# Patient Record
Sex: Female | Born: 1995 | Race: White | Hispanic: No | Marital: Married | Smoking: Never smoker
Health system: Southern US, Community
[De-identification: ages and names within clinical notes are randomized; demographics above are authoritative.]

## PROBLEM LIST (undated history)

## (undated) DIAGNOSIS — T7840XA Allergy, unspecified, initial encounter: Secondary | ICD-10-CM

## (undated) DIAGNOSIS — F329 Major depressive disorder, single episode, unspecified: Secondary | ICD-10-CM

## (undated) DIAGNOSIS — F32A Depression, unspecified: Secondary | ICD-10-CM

## (undated) DIAGNOSIS — G43909 Migraine, unspecified, not intractable, without status migrainosus: Secondary | ICD-10-CM

## (undated) DIAGNOSIS — K589 Irritable bowel syndrome without diarrhea: Secondary | ICD-10-CM

## (undated) DIAGNOSIS — R040 Epistaxis: Secondary | ICD-10-CM

## (undated) DIAGNOSIS — M419 Scoliosis, unspecified: Secondary | ICD-10-CM

## (undated) DIAGNOSIS — N83209 Unspecified ovarian cyst, unspecified side: Secondary | ICD-10-CM

## (undated) DIAGNOSIS — R5600 Simple febrile convulsions: Secondary | ICD-10-CM

## (undated) DIAGNOSIS — R000 Tachycardia, unspecified: Secondary | ICD-10-CM

## (undated) DIAGNOSIS — T8859XA Other complications of anesthesia, initial encounter: Secondary | ICD-10-CM

## (undated) DIAGNOSIS — F419 Anxiety disorder, unspecified: Secondary | ICD-10-CM

## (undated) HISTORY — DX: Unspecified ovarian cyst, unspecified side: N83.209

## (undated) HISTORY — DX: Depression, unspecified: F32.A

## (undated) HISTORY — DX: Epistaxis: R04.0

## (undated) HISTORY — PX: WISDOM TOOTH EXTRACTION: SHX21

## (undated) HISTORY — DX: Simple febrile convulsions: R56.00

## (undated) HISTORY — DX: Tachycardia, unspecified: R00.0

## (undated) HISTORY — DX: Irritable bowel syndrome, unspecified: K58.9

## (undated) HISTORY — DX: Anxiety disorder, unspecified: F41.9

## (undated) HISTORY — DX: Scoliosis, unspecified: M41.9

## (undated) HISTORY — DX: Migraine, unspecified, not intractable, without status migrainosus: G43.909

## (undated) HISTORY — DX: Allergy, unspecified, initial encounter: T78.40XA

---
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
## 2016-07-31 DIAGNOSIS — F419 Anxiety disorder, unspecified: Secondary | ICD-10-CM | POA: Insufficient documentation

## 2016-07-31 DIAGNOSIS — F32A Depression, unspecified: Secondary | ICD-10-CM | POA: Insufficient documentation

## 2016-07-31 DIAGNOSIS — A6920 Lyme disease, unspecified: Secondary | ICD-10-CM | POA: Insufficient documentation

## 2017-02-02 DIAGNOSIS — Z113 Encounter for screening for infections with a predominantly sexual mode of transmission: Secondary | ICD-10-CM | POA: Diagnosis not present

## 2017-02-02 DIAGNOSIS — Z0001 Encounter for general adult medical examination with abnormal findings: Secondary | ICD-10-CM | POA: Diagnosis not present

## 2017-02-02 DIAGNOSIS — K580 Irritable bowel syndrome with diarrhea: Secondary | ICD-10-CM | POA: Diagnosis not present

## 2017-02-03 DIAGNOSIS — K580 Irritable bowel syndrome with diarrhea: Secondary | ICD-10-CM | POA: Diagnosis not present

## 2017-02-03 DIAGNOSIS — Z113 Encounter for screening for infections with a predominantly sexual mode of transmission: Secondary | ICD-10-CM | POA: Diagnosis not present

## 2017-02-03 DIAGNOSIS — Z0001 Encounter for general adult medical examination with abnormal findings: Secondary | ICD-10-CM | POA: Diagnosis not present

## 2017-02-03 LAB — CBC AND DIFFERENTIAL
HCT: 44 (ref 36–46)
Hemoglobin: 14.8 (ref 12.0–16.0)
Neutrophils Absolute: 6168
Platelets: 397 (ref 150–399)
WBC: 9.9
WBC: 9.9

## 2017-02-03 LAB — BASIC METABOLIC PANEL
BUN: 11 (ref 4–21)
CO2: 26 — AB (ref 13–22)
Chloride: 104 (ref 99–108)
Creatinine: 0.8 (ref 0.5–1.1)
Glucose: 74
Potassium: 4.1 (ref 3.4–5.3)
Sodium: 139 (ref 137–147)

## 2017-02-03 LAB — COMPREHENSIVE METABOLIC PANEL
Albumin: 4.9 (ref 3.5–5.0)
Calcium: 9.9 (ref 8.7–10.7)
GFR calc Af Amer: 130
GFR calc non Af Amer: 112
Globulin: 3

## 2017-02-03 LAB — HEPATIC FUNCTION PANEL
ALT: 35 (ref 7–35)
AST: 22 (ref 13–35)
Alkaline Phosphatase: 80 (ref 25–125)
Bilirubin, Total: 0.4

## 2017-02-03 LAB — HIV ANTIBODY (ROUTINE TESTING W REFLEX): HIV 1&2 Ab, 4th Generation: NEGATIVE

## 2017-02-03 LAB — CBC: RBC: 5.24 — AB (ref 3.87–5.11)

## 2017-02-03 LAB — TSH: TSH: 0.7 (ref 0.41–5.90)

## 2017-04-14 DIAGNOSIS — Z111 Encounter for screening for respiratory tuberculosis: Secondary | ICD-10-CM | POA: Diagnosis not present

## 2018-01-25 DIAGNOSIS — N920 Excessive and frequent menstruation with regular cycle: Secondary | ICD-10-CM | POA: Diagnosis not present

## 2018-01-25 DIAGNOSIS — G43009 Migraine without aura, not intractable, without status migrainosus: Secondary | ICD-10-CM | POA: Diagnosis not present

## 2018-01-25 DIAGNOSIS — Z111 Encounter for screening for respiratory tuberculosis: Secondary | ICD-10-CM | POA: Diagnosis not present

## 2018-01-26 LAB — HEPATIC FUNCTION PANEL
ALT: 30 (ref 7–35)
AST: 20 (ref 13–35)
Alkaline Phosphatase: 85 (ref 25–125)
Bilirubin, Total: 0.5

## 2018-01-26 LAB — TSH: TSH: 1.71 (ref 0.41–5.90)

## 2018-01-26 LAB — BASIC METABOLIC PANEL
BUN: 8 (ref 4–21)
CO2: 25 — AB (ref 13–22)
Chloride: 102 (ref 99–108)
Creatinine: 0.8 (ref 0.5–1.1)
Glucose: 83
Potassium: 4.4 (ref 3.4–5.3)
Sodium: 139 (ref 137–147)

## 2018-01-26 LAB — COMPREHENSIVE METABOLIC PANEL
Albumin: 4.6 (ref 3.5–5.0)
Calcium: 10.6 (ref 8.7–10.7)
GFR calc Af Amer: 113
GFR calc non Af Amer: 97
Globulin: 3

## 2018-01-26 LAB — CBC AND DIFFERENTIAL
HCT: 46 (ref 36–46)
Hemoglobin: 15.2 (ref 12.0–16.0)
Neutrophils Absolute: 4618
Platelets: 358 (ref 150–399)
WBC: 8.6

## 2018-01-26 LAB — CBC: RBC: 5.32 — AB (ref 3.87–5.11)

## 2018-04-13 DIAGNOSIS — R000 Tachycardia, unspecified: Secondary | ICD-10-CM | POA: Diagnosis not present

## 2018-04-13 DIAGNOSIS — G43009 Migraine without aura, not intractable, without status migrainosus: Secondary | ICD-10-CM | POA: Diagnosis not present

## 2019-01-07 ENCOUNTER — Encounter: Payer: Self-pay | Admitting: Family

## 2019-01-07 ENCOUNTER — Ambulatory Visit (INDEPENDENT_AMBULATORY_CARE_PROVIDER_SITE_OTHER): Payer: BC Managed Care – PPO | Admitting: Family

## 2019-01-07 ENCOUNTER — Other Ambulatory Visit: Payer: Self-pay

## 2019-01-07 VITALS — BP 130/80 | HR 114 | Temp 99.5°F | Ht 66.0 in | Wt 145.8 lb

## 2019-01-07 DIAGNOSIS — G43109 Migraine with aura, not intractable, without status migrainosus: Secondary | ICD-10-CM | POA: Diagnosis not present

## 2019-01-07 DIAGNOSIS — M419 Scoliosis, unspecified: Secondary | ICD-10-CM | POA: Insufficient documentation

## 2019-01-07 DIAGNOSIS — F339 Major depressive disorder, recurrent, unspecified: Secondary | ICD-10-CM | POA: Insufficient documentation

## 2019-01-07 DIAGNOSIS — F411 Generalized anxiety disorder: Secondary | ICD-10-CM | POA: Diagnosis not present

## 2019-01-07 DIAGNOSIS — R000 Tachycardia, unspecified: Secondary | ICD-10-CM | POA: Insufficient documentation

## 2019-01-07 DIAGNOSIS — N83209 Unspecified ovarian cyst, unspecified side: Secondary | ICD-10-CM

## 2019-01-07 DIAGNOSIS — K582 Mixed irritable bowel syndrome: Secondary | ICD-10-CM | POA: Insufficient documentation

## 2019-01-07 DIAGNOSIS — Z87898 Personal history of other specified conditions: Secondary | ICD-10-CM

## 2019-01-07 MED ORDER — METOPROLOL TARTRATE 25 MG PO TABS: 12.5000 mg | ORAL_TABLET | Freq: Two times a day (BID) | ORAL | 0 refills | Status: DC

## 2019-01-07 NOTE — Progress Notes (Signed)
Provider: Marlowe Sax FNP-C   Riyansh Gerstner, Nelda Bucks, NP  Patient Care Team: Pria Klosinski, Nelda Bucks, NP as PCP - General (Family Medicine)  Extended Emergency Contact Information Primary Emergency Contact: Maddyx, Vallie Mobile Phone: [phone] Relation: Spouse   Code Status: Full code  Goals of care: Advanced Directive information No flowsheet data found.   Chief Complaint  Patient presents with  . New Patient (Initial Visit)    New Patient to Establish Care patient would like referral to cardiology states she has  tachycardia also would like referral to neurology. Patient states she plans to have baby in next year. Patient would like to get set up for pap smear.     HPI:  Pt is a 23 y.o. 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  Tachycardia - Has had this for several years.Has never taken any medication for her tachycardia.she has palpitation.she does gets some chest pain if she exert herself like climbing up the stairs or trying to exercise if HR goes to 200.she has a smart watch that monitors her heart rate gives her a warning if HR is too high.This has helped her a lot to control her tachycardia.she denies any chest pain or shortness of breath.she states would like referral to cardiology for chronicTachycardia.  Migraines - Describes migraines as real bad.Has nausea,vomiting and left side weakness whenever migraines occur.she has not had this for a while now.she would also like referral to neurology for evaluation. Currently on Excedrin four times daily as needed and  amitriptyline 75 mg tablet daily at bedtime which states helps with her sleep too.   Generalized anxiety/depression - states symptoms under control not on antianxiety medication. No  thoughts of suicide.  Ovarian  Cysts - has had this for several years.Has been treated with Oral contraceptive since she was 23 years old.currnetly on Larissia 0.1- 20 mg -mcg Tablet daily.she states  plans to have a baby in a year. Patient would like to get set up for pap smear with Gyn.  Health Maintenance - she is up to date on her Influenza vaccine given 2020 and Tdap last given 2017. 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    Past Medical History:  Diagnosis Date  . Anxiety   . Depression   . Epistaxis   . Febrile seizure ([place])    23 y.o.   . IBS (irritable bowel syndrome)   . Migraines   . Ovarian cyst   . Scoliosis   . Tachycardia    Past Surgical History:  Procedure Laterality Date  . WISDOM TOOTH EXTRACTION     2017 [facility] Facial Surgery     No Known Allergies  Allergies as of 01/07/2019   No Known Allergies     Medication List       Accurate as of January 07, 2019 11:38 AM. If you have any questions, ask your nurse or doctor.        amitriptyline 75 MG tablet Commonly known as: ELAVIL Take 75 mg by mouth at bedtime.   [place] Patient takes 1 tab by mouth 4 times a month as needed   Larissia 0.1-20 MG-MCG tablet Generic drug: levonorgestrel-ethinyl estradiol Take 1 tablet by  mouth daily.   One-A-Day Womens tablet Take 1 tablet by mouth daily.       Review of Systems  Constitutional: Negative for appetite change, chills and fatigue.       Temp 99.5   HENT: Negative for congestion, rhinorrhea, sinus pressure, sinus pain, sneezing and sore throat.   Eyes: Positive for visual disturbance. Negative for pain, discharge and redness.       Wears eye contact   Respiratory: Negative for chest tightness, shortness of breath and wheezing.   Cardiovascular: Positive for palpitations. Negative for chest  pain and leg swelling.  Gastrointestinal: Negative for abdominal distention, abdominal pain, constipation, nausea and vomiting.       Hx IBS   Endocrine: Positive for cold intolerance. Negative for heat intolerance, polydipsia, polyphagia and polyuria.  Genitourinary: Negative for difficulty urinating, dysuria, flank pain, frequency and urgency.  Musculoskeletal: Negative for arthralgias and gait problem.  Skin: Negative for color change, pallor and rash.  Neurological: Positive for headaches. Negative for dizziness, syncope, weakness and light-headedness.       Migraines   Hematological: Bruises/bleeds easily.  Psychiatric/Behavioral: Negative for agitation, confusion, sleep disturbance and suicidal ideas. The patient is nervous/anxious.     Immunization History  Administered Date(s) Administered  . Influenza,inj,Quad PF,6+ Mos 12/10/2018  . Td 02/14/2016   Pertinent  Health Maintenance Due  Topic Date Due  . PAP SMEAR-Modifier  09/24/2014  . INFLUENZA VACCINE  Completed   No flowsheet data found.   Vitals:   01/07/19 1107  BP: 130/80  Pulse: (!) 114  Temp: 99.5 F (37.5 C)  TempSrc: Temporal  SpO2: 97%  Weight: 145 lb 12.8 oz (66.1 kg)  Height: _0  (1.676 m)   Body mass index is 23.53 kg/m. Physical Exam Constitutional:      General: She is not in acute distress.    Appearance: She is normal weight. She is not ill-appearing.  HENT:     Head: Normocephalic.     Right Ear: Tympanic membrane, ear canal and external ear normal. There is no impacted cerumen.     Left Ear: Tympanic membrane, ear canal and external ear normal. There is no impacted cerumen.     Nose: Nose normal. No congestion or rhinorrhea.     Mouth/Throat:     Mouth: Mucous membranes are moist.     Pharynx: Oropharynx is clear. No oropharyngeal exudate or posterior oropharyngeal erythema.  Eyes:     General: No scleral icterus.       Right eye: No discharge.        Left eye: No discharge.      Extraocular Movements: Extraocular movements intact.     Conjunctiva/sclera: Conjunctivae normal.     Pupils: Pupils are equal, round, and reactive to light.  Neck:     Musculoskeletal: Normal range of motion. No neck rigidity or muscular tenderness.     Vascular: No carotid bruit.  Cardiovascular:     Rate and Rhythm: Regular rhythm. Tachycardia present.     Pulses: Normal pulses.     Heart sounds: Normal heart sounds. No murmur. No friction rub. No gallop.   Pulmonary:     Effort: Pulmonary effort is normal. No respiratory distress.     Breath sounds: Normal breath sounds. No wheezing, rhonchi or rales.  Chest:     Chest wall: No tenderness.  Abdominal:     General: Bowel sounds are normal.     Palpations: Abdomen is soft. There is no mass.  Tenderness: There is no right CVA tenderness, left CVA tenderness, guarding or rebound.     Comments: Left lower Quadrant tender to deep palpation   Lymphadenopathy:     Cervical: No cervical adenopathy.  Neurological:     Mental Status: She is alert.    Labs reviewed: No results for input(s): NA, K, CL, CO2, GLUCOSE, BUN, CREATININE, CALCIUM, MG, PHOS in the last 8760 hours. No results for input(s): AST, ALT, ALKPHOS, BILITOT, PROT, ALBUMIN in the last 8760 hours. No results for input(s): WBC, NEUTROABS, HGB, HCT, MCV, PLT in the last 8760 hours. No results found for: TSH No results found for: HGBA1C No results found for: CHOL, HDL, LDLCALC, LDLDIRECT, TRIG, CHOLHDL  Significant Diagnostic Results in last 30 days:  No results found.  Assessment/Plan 1. Tachycardia HR 114 ongoing since she was young.Has never been seen by a cardiology.Past TSH level have been within normal range.Has learned to copy with symptoms and takes a rest when HR is high. - EKG 12-Lead done this visit indicates sinus Tachycardia rate 103 b/min. - Ambulatory referral to Cardiology - metoprolol tartrate (LOPRESSOR) 25 MG tablet; Take 0.5 tablets (12.5 mg total)  by mouth 2 (two) times daily.  Dispense: 60 tablet; Refill: 0 - TSH; Future - CBC with Differential/Platelet; Future - Lipid panel; Future  2. Migraine with aura and without status migrainosus, not intractable Chronic.continue on Excedrin as needed  and amitriptyline 75 mg tablet at bedtime. - Ambulatory referral to Neurology  3. Generalized anxiety disorder Reports stable.currently not on any antianxiety.D/W adding antianxiety which could be contributing to her Tachycardia.will rule out other Cardio etiologies.   4. Recurrent major depressive disorder, remission status unspecified (HCC) Mood stable.continue on amitriptyline 75 mg tablet at bedtime.   5. Irritable bowel syndrome with both constipation and diarrhea Reports symptoms under control.will refer to GI if needed.  - CMP with eGFR([facility]); Future  6. Cyst of ovary, unspecified laterality Chronic since teenage.continue on [place] tablet daily.will call her Gyn.Planning to have a baby in one year.   7. Scoliosis, unspecified scoliosis type, unspecified spinal region Chronic.reports no new issues with pain,weakness or shortness of breath.   8. Hx of epistaxis Occasional nasal bleeding.  Family/ staff Communication: Reviewed plan of care with patient  Labs/tests ordered:  - TSH; Future - CBC with Differential/Platelet; Future - Lipid panel; Future - CMP with eGFR([facility]); Future  Next appointment : 2 weeks for Tachycardia   Sandrea Hughs, NP

## 2019-01-13 ENCOUNTER — Other Ambulatory Visit: Payer: Self-pay

## 2019-01-13 DIAGNOSIS — K582 Mixed irritable bowel syndrome: Secondary | ICD-10-CM

## 2019-01-13 DIAGNOSIS — R000 Tachycardia, unspecified: Secondary | ICD-10-CM

## 2019-01-14 ENCOUNTER — Other Ambulatory Visit: Payer: Self-pay

## 2019-01-14 ENCOUNTER — Other Ambulatory Visit: Payer: BC Managed Care – PPO

## 2019-01-14 DIAGNOSIS — K582 Mixed irritable bowel syndrome: Secondary | ICD-10-CM | POA: Diagnosis not present

## 2019-01-14 DIAGNOSIS — R000 Tachycardia, unspecified: Secondary | ICD-10-CM | POA: Diagnosis not present

## 2019-01-14 DIAGNOSIS — E785 Hyperlipidemia, unspecified: Secondary | ICD-10-CM | POA: Diagnosis not present

## 2019-01-15 LAB — CBC WITH DIFFERENTIAL/PLATELET
Absolute Monocytes: 423 cells/uL (ref 200–950)
Basophils Absolute: 44 cells/uL (ref 0–200)
Basophils Relative: 0.6 %
Eosinophils Absolute: 102 cells/uL (ref 15–500)
Eosinophils Relative: 1.4 %
HCT: 42.4 % (ref 35.0–45.0)
Hemoglobin: 13.9 g/dL (ref 11.7–15.5)
Lymphs Abs: 3146 cells/uL (ref 850–3900)
MCH: 28.4 pg (ref 27.0–33.0)
MCHC: 32.8 g/dL (ref 32.0–36.0)
MCV: 86.5 fL (ref 80.0–100.0)
MPV: 9.4 fL (ref 7.5–12.5)
Monocytes Relative: 5.8 %
Neutro Abs: 3584 cells/uL (ref 1500–7800)
Neutrophils Relative %: 49.1 %
Platelets: 368 10*3/uL (ref 140–400)
RBC: 4.9 10*6/uL (ref 3.80–5.10)
RDW: 12.1 % (ref 11.0–15.0)
Total Lymphocyte: 43.1 %
WBC: 7.3 10*3/uL (ref 3.8–10.8)

## 2019-01-15 LAB — LIPID PANEL
Cholesterol: 186 mg/dL (ref <200)
HDL: 44 mg/dL — ABNORMAL LOW (ref >=50)
LDL Cholesterol (Calc): 126 mg/dL (calc) — ABNORMAL HIGH
Non-HDL Cholesterol (Calc): 142 mg/dL (calc) — ABNORMAL HIGH (ref <130)
Total CHOL/HDL Ratio: 4.2 (calc) (ref <5.0)
Triglycerides: 70 mg/dL (ref <150)

## 2019-01-15 LAB — COMPLETE METABOLIC PANEL WITH GFR
AG Ratio: 1.6 (calc) (ref 1.0–2.5)
ALT: 36 U/L — ABNORMAL HIGH (ref 6–29)
AST: 19 U/L (ref 10–30)
Albumin: 4.2 g/dL (ref 3.6–5.1)
Alkaline phosphatase (APISO): 74 U/L (ref 31–125)
BUN: 11 mg/dL (ref 7–25)
CO2: 23 mmol/L (ref 20–32)
Calcium: 9.4 mg/dL (ref 8.6–10.2)
Chloride: 109 mmol/L (ref 98–110)
Creat: 0.94 mg/dL (ref 0.50–1.10)
GFR, Est African American: 98 mL/min/{1.73_m2} (ref >=60)
GFR, Est Non African American: 84 mL/min/{1.73_m2} (ref >=60)
Globulin: 2.6 g/dL (calc) (ref 1.9–3.7)
Glucose, Bld: 97 mg/dL (ref 65–99)
Potassium: 4.6 mmol/L (ref 3.5–5.3)
Sodium: 142 mmol/L (ref 135–146)
Total Bilirubin: 0.5 mg/dL (ref 0.2–1.2)
Total Protein: 6.8 g/dL (ref 6.1–8.1)

## 2019-01-15 LAB — TSH: TSH: 1.31 mIU/L

## 2019-01-21 ENCOUNTER — Ambulatory Visit (INDEPENDENT_AMBULATORY_CARE_PROVIDER_SITE_OTHER): Payer: BC Managed Care – PPO | Admitting: Family

## 2019-01-21 ENCOUNTER — Other Ambulatory Visit: Payer: Self-pay

## 2019-01-21 ENCOUNTER — Encounter: Payer: Self-pay | Admitting: Family

## 2019-01-21 VITALS — BP 130/80 | HR 102 | Temp 97.6°F | Ht 66.0 in | Wt 147.2 lb

## 2019-01-21 DIAGNOSIS — G43109 Migraine with aura, not intractable, without status migrainosus: Secondary | ICD-10-CM | POA: Diagnosis not present

## 2019-01-21 DIAGNOSIS — R000 Tachycardia, unspecified: Secondary | ICD-10-CM

## 2019-01-21 NOTE — Progress Notes (Signed)
Provider: Marlowe Sax FNP-C  Levis Nazir, Nelda Bucks, NP  Patient Care Team: Davie Sagona, Nelda Bucks, NP as PCP - General (Family Medicine)  Extended Emergency Contact Information Primary Emergency Contact: Janeisha, Ryle Mobile Phone: [phone] Relation: Spouse  Code Status:  Full Code  Goals of care: Advanced Directive information No flowsheet data found.   Chief Complaint  Patient presents with  . Follow-up    2-week followup for tachycardia and continues with migraines    HPI:  Pt is a 23 y.o. female seen today for an acute visit for 2 weeks follow up for Tachycardia.started on metoprolol 12.5 mg tablet twice daily in the previous visit.she states has been tracking her heart rate using her apple watch and has not seen any changes compared to previous readings before taking medication.she denies chest pain but states gets chest tightness on exertion like climbing the stairs.she noted HR in the 170's climbing the stairs to work.usually takes rest and HR goes down.she has upcoming appointment with cardiology 01/24/2019.  She continues to have migraines about 4 times per month.Has upcoming appointment with Neurologist in one week.    Past Medical History:  Diagnosis Date  . Anxiety   . Depression   . Epistaxis   . Febrile seizure ([place])    23 y.o.   . IBS (irritable bowel syndrome)   . Migraines   . Ovarian cyst   . Scoliosis   . Tachycardia    Past Surgical History:  Procedure Laterality Date  . WISDOM TOOTH EXTRACTION     2017 [facility] Facial Surgery     No Known Allergies  Outpatient Encounter Medications as of 01/21/2019  Medication Sig  . amitriptyline (ELAVIL) 75 MG tablet Take 75 mg by mouth at bedtime.  . Aspirin-Acetaminophen-Caffeine (EXCEDRIN MIGRAINE PO) Patient takes 1 tab by mouth 4 times a month as needed  . LARISSIA 0.1-20 MG-MCG tablet Take 1 tablet by mouth daily.  . metoprolol tartrate (LOPRESSOR) 25 MG tablet Take 0.5 tablets (12.5 mg total) by mouth  2 (two) times daily.  . Multiple Vitamins-Minerals (ONE-A-DAY WOMENS) tablet Take 1 tablet by mouth daily.   No facility-administered encounter medications on file as of 01/21/2019.     Review of Systems  Constitutional: Negative for chills, fatigue and fever.  Respiratory: Negative for cough, chest tightness, shortness of breath and wheezing.   Cardiovascular: Negative for chest pain, palpitations and leg swelling.    Immunization History  Administered Date(s) Administered  . Influenza,inj,Quad PF,6+ Mos 12/10/2018  . Td 02/14/2016   Pertinent  Health Maintenance Due  Topic Date Due  . PAP SMEAR-Modifier  09/24/2014  . INFLUENZA VACCINE  Completed   No flowsheet data found.  Vitals:   01/21/19 1250 01/21/19 1307  BP: 130/80   Pulse: (!) 116 (!) 102  Temp: 97.6 F (36.4 C)   TempSrc: Oral   SpO2: 99%   Weight: 147 lb 3.2 oz (66.8 kg)   Height: 5\' 6"  (1.676 m)    Body mass index is 23.76 kg/m. Physical Exam Vitals signs reviewed.  Constitutional:      General: She is not in acute distress.    Appearance: She is normal weight. She is not ill-appearing.  HENT:     Nose: Nose normal. No congestion or rhinorrhea.     Mouth/Throat:     Mouth: Mucous membranes are moist.     Pharynx: Oropharynx is clear. No oropharyngeal exudate or posterior oropharyngeal erythema.  Eyes:     General: No scleral icterus.  Right eye: No discharge.        Left eye: No discharge.     Conjunctiva/sclera: Conjunctivae normal.     Pupils: Pupils are equal, round, and reactive to light.  Neck:     Musculoskeletal: Normal range of motion. No neck rigidity or muscular tenderness.     Vascular: No carotid bruit.  Cardiovascular:     Rate and Rhythm: Regular rhythm. Tachycardia present.     Pulses: Normal pulses.     Heart sounds: Normal heart sounds. No murmur. No friction rub. No gallop.   Pulmonary:     Effort: Pulmonary effort is normal. No respiratory distress.     Breath sounds:  Normal breath sounds. No wheezing, rhonchi or rales.  Chest:     Chest wall: No tenderness.  Abdominal:     General: Bowel sounds are normal. There is no distension.     Palpations: Abdomen is soft. There is no mass.     Tenderness: There is no abdominal tenderness. There is no right CVA tenderness, left CVA tenderness, guarding or rebound.  Musculoskeletal: Normal range of motion.        General: No swelling or tenderness.     Right lower leg: No edema.     Left lower leg: No edema.  Lymphadenopathy:     Cervical: No cervical adenopathy.  Skin:    General: Skin is warm and dry.     Coloration: Skin is not pale.     Findings: No bruising, erythema or rash.  Neurological:     Mental Status: She is alert and oriented to person, place, and time.     Cranial Nerves: No cranial nerve deficit.     Sensory: No sensory deficit.     Motor: No weakness.     Gait: Gait normal.  Psychiatric:        Mood and Affect: Mood normal.        Behavior: Behavior normal.        Thought Content: Thought content normal.        Judgment: Judgment normal.    Labs reviewed: Recent Labs    01/14/19 0813  NA 142  K 4.6  CL 109  CO2 23  GLUCOSE 97  BUN 11  CREATININE 0.94  CALCIUM 9.4   Recent Labs    01/14/19 0813  AST 19  ALT 36*  BILITOT 0.5  PROT 6.8   Recent Labs    01/14/19 0813  WBC 7.3  NEUTROABS 3,584  HGB 13.9  HCT 42.4  MCV 86.5  PLT 368   Lab Results  Component Value Date   TSH 1.31 01/14/2019   No results found for: HGBA1C Lab Results  Component Value Date   CHOL 186 01/14/2019   HDL 44 (L) 01/14/2019   LDLCALC 126 (H) 01/14/2019   TRIG 70 01/14/2019   CHOLHDL 4.2 01/14/2019    Significant Diagnostic Results in last 30 days:  No results found.  Assessment/Plan  1. Tachycardia HR persistent > 110's despite use of metoprolol 12.5 mg tablet twice daily.Recommend increase medication to 25 mg tablet but patient's prefers to wait until Monday to see the  cardiologist.continue on metoprolol.Encourage to take rest and avoid strenuous activities.   2. Migraine with aura and without status migrainosus, not intractable Continue on amitriptyline and Excedrin.Follow up with Neurologist.  Family/ staff Communication: Reviewed plan of care with patient.  Labs/tests ordered: None   Next appointment: 6 months for medical management of chronic issues.  Sandrea Hughs, NP

## 2019-01-24 ENCOUNTER — Other Ambulatory Visit: Payer: Self-pay

## 2019-01-24 ENCOUNTER — Ambulatory Visit: Payer: BC Managed Care – PPO | Admitting: Cardiovascular Disease

## 2019-01-24 ENCOUNTER — Encounter: Payer: Self-pay | Admitting: Cardiovascular Disease

## 2019-01-24 ENCOUNTER — Other Ambulatory Visit: Payer: Self-pay | Admitting: Cardiovascular Disease

## 2019-01-24 VITALS — BP 102/74 | HR 117 | Ht 65.0 in | Wt 146.8 lb

## 2019-01-24 DIAGNOSIS — R000 Tachycardia, unspecified: Secondary | ICD-10-CM | POA: Diagnosis not present

## 2019-01-24 MED ORDER — IVABRADINE HCL 5 MG PO TABS: 5.0000 mg | ORAL_TABLET | Freq: Two times a day (BID) | ORAL | 11 refills | Status: DC

## 2019-01-24 NOTE — Patient Instructions (Signed)
Medication Instructions:  Your physician has recommended you make the following change in your medication: START Corlanor (Ivabradine) 5 mg twice daily  *If you need a refill on your cardiac medications before your next appointment, please call your pharmacy*  Lab Work: None Ordered   Testing/Procedures: Your physician has requested that you have an echocardiogram. Echocardiography is a painless test that uses sound waves to create images of your heart. It provides your doctor with information about the size and shape of your heart and how well your heart's chambers and valves are working. This procedure takes approximately one hour. There are no restrictions for this procedure.    Follow-Up: At [facility], you and your health needs are our priority.  As part of our continuing mission to provide you with exceptional heart care, we have created designated Provider Care Teams.  These Care Teams include your primary Cardiologist (physician) and Advanced Practice Providers (APPs -  Physician Assistants and Nurse Practitioners) who all work together to provide you with the care you need, when you need it.  Your next appointment:   4 weeks on December 7  The format for your next appointment:   In Person  Provider:   You may see Dr. Acie Fredrickson or one of the following Advanced Practice Providers on your designated Care Team:    Richardson Dopp, PA-C  [place], [place]  Daune Perch, [place]

## 2019-01-24 NOTE — Progress Notes (Signed)
Cardiology Office Note:    Date:  01/24/2019   ID:  Virginia Kane, DOB 03-01-1996, MRN [id number]  PCP:  Caesar Bookman, NP  Cardiologist:  Nahser  Electrophysiologist:  None   Referring MD: Caesar Bookman, NP   Chief Complaint  Patient presents with  . Tachycardia     History of Present Illness:    Virginia Kane is a 23 y.o. female with a hx of sinus tach Has had since age 74  Has had echo in the past  Does not do cardio because she is scared.  Sleeps well  Does not exercise as much  Takes Elavil for migraines Tachycardia does not vary with her menstral cycle.   easts and drinks regularly .   previous work up is not available    Past Medical History:  Diagnosis Date  . Anxiety   . Depression   . Epistaxis   . Febrile seizure (HCC)    23 y.o.   . IBS (irritable bowel syndrome)   . Migraines   . Ovarian cyst   . Scoliosis   . Tachycardia     Past Surgical History:  Procedure Laterality Date  . WISDOM TOOTH EXTRACTION     2017 [facility]     Current Medications: Current Meds  Medication Sig  . amitriptyline (ELAVIL) 75 MG tablet Take 75 mg by mouth at bedtime.  . Aspirin-Acetaminophen-Caffeine (EXCEDRIN MIGRAINE PO) Patient takes 1 tab by mouth 4 times a month as needed  . LARISSIA 0.1-20 MG-MCG tablet Take 1 tablet by mouth daily.  . metoprolol tartrate (LOPRESSOR) 25 MG tablet Take 0.5 tablets (12.5 mg total) by mouth 2 (two) times daily.  . Multiple Vitamins-Minerals (ONE-A-DAY WOMENS) tablet Take 1 tablet by mouth daily.     Allergies:   Patient has no known allergies.   Social History   Socioeconomic History  . Marital status: Married    Spouse name: Not on file  . Number of children: Not on file  . Years of education: Not on file  . Highest education level: Not on file  Occupational History  . Not on file  Social Needs  . Financial resource strain: Not on file  . Food insecurity    Worry: Not on file    Inability: Not on  file  . Transportation needs    Medical: Not on file    Non-medical: Not on file  Tobacco Use  . Smoking status: Never Smoker  . Smokeless tobacco: Never Used  Substance and Sexual Activity  . Alcohol use: Yes    Alcohol/week: 1.0 - 2.0 standard drinks    Types: 1 - 2 Standard drinks or equivalent per week  . Drug use: Not on file  . Sexual activity: Not on file  Lifestyle  . Physical activity    Days per week: Not on file    Minutes per session: Not on file  . Stress: Not on file  Relationships  . Social Musician on phone: Not on file    Gets together: Not on file    Attends religious service: Not on file    Active member of club or organization: Not on file    Attends meetings of clubs or organizations: Not on file    Relationship status: Not on file  Other Topics Concern  . Not on file  Social History Narrative   Social History      Diet? regular      Do  you drink/eat things with caffeine? yes      Marital status?           married                         What year were you married? 2020      Do you live in a house, apartment, assisted living, condo, trailer, etc.?  house      Is it one or more stories? one      How many persons live in your home? 2       Do you have any pets in your home? (please list) 2 dog 2 lizards      Highest level of education completed? 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       no                               Type & how often? Need to more often       Advanced Directives      Do you have a living will? no      Do you have a DNR form?               no                   If not, do you want to discuss one?no       Do you have signed POA/HPOA for forms? No       Functional Status      Do you have difficulty bathing or dressing yourself? no      Do you have difficulty preparing food or eating? no      Do you have difficulty managing your medications? No       Do  you have difficulty managing your finances? No       Do you have difficulty affording your medications? No         Family History: The patient's family history includes Anxiety disorder in her brother and mother; Arthritis in her maternal grandmother; Autism in her brother; Basal cell carcinoma in her mother; Breast cancer in her maternal grandmother; Diabetes in her maternal grandmother; Sjogren's syndrome in her mother; Skin cancer in her maternal grandmother.  ROS:   Please see the history of present illness.     All other systems reviewed and are negative.  EKGs/Labs/Other Studies Reviewed:    The following studies were reviewed today:   EKG:    Nov. 9, 2020:   Sinus tach at 117.   TWI inferiorl   Recent Labs: 01/14/2019: ALT 36; BUN 11; Creat 0.94; Hemoglobin 13.9; Platelets 368; Potassium 4.6; Sodium 142; TSH 1.31  Recent Lipid Panel    Component Value Date/Time   CHOL 186 01/14/2019 0813   TRIG 70 01/14/2019 0813   HDL 44 (L) 01/14/2019 0813   CHOLHDL 4.2 01/14/2019 0813   LDLCALC 126 (H) 01/14/2019 0813    Physical Exam:    VS:  BP 102/74   Pulse (!) 117   Ht 5\' 5"  (1.651 m)   Wt 146 lb 12.8 oz (66.6 kg)   SpO2 98%   BMI 24.43 kg/m     Wt Readings from Last 3 Encounters:  01/24/19 146 lb 12.8 oz (66.6 kg)  01/21/19 147 lb 3.2  oz (66.8 kg)  01/07/19 145 lb 12.8 oz (66.1 kg)     GEN:  Well nourished, well developed in no acute distress HEENT: Normal NECK: No JVD; No carotid bruits LYMPHATICS: No lymphadenopathy CARDIAC: RRR, no murmurs, rubs, gallops RESPIRATORY:  Clear to auscultation without rales, wheezing or rhonchi  ABDOMEN: Soft, non-tender, non-distended MUSCULOSKELETAL:  No edema; No deformity  SKIN: Warm and dry NEUROLOGIC:  Alert and oriented x 3 PSYCHIATRIC:  Normal affect   ASSESSMENT:    1. Sinus tachycardia    PLAN:    In order of problems listed above:  1. Sinus tachycardia: He will he has had episodes of sinus tachycardia for  many years.  She is on low-dose metoprolol but yet her heart rate remains over 117.  She does not really exercise because it makes her heart pound so much.  We will start her on Corlanor 5 mg p.o. twice daily.  Continue metoprolol.  We will get an echocardiogram.  I will see her again in 4 to 6 weeks for a follow-up visit.   Medication Adjustments/Labs and Tests Ordered: Current medicines are reviewed at length with the patient today.  Concerns regarding medicines are outlined above.  Orders Placed This Encounter  Procedures  . EKG 12-Lead  . ECHOCARDIOGRAM COMPLETE   Meds ordered this encounter  Medications  . ivabradine (CORLANOR) 5 MG TABS tablet    Sig: Take 1 tablet (5 mg total) by mouth 2 (two) times daily with a meal.    Dispense:  60 tablet    Refill:  11    Patient Instructions  Medication Instructions:  Your physician has recommended you make the following change in your medication: START Corlanor (Ivabradine) 5 mg twice daily  *If you need a refill on your cardiac medications before your next appointment, please call your pharmacy*  Lab Work: None Ordered   Testing/Procedures: Your physician has requested that you have an echocardiogram. Echocardiography is a painless test that uses sound waves to create images of your heart. It provides your doctor with information about the size and shape of your heart and how well your heart's chambers and valves are working. This procedure takes approximately one hour. There are no restrictions for this procedure.    Follow-Up: At [facility], you and your health needs are our priority.  As part of our continuing mission to provide you with exceptional heart care, we have created designated Provider Care Teams.  These Care Teams include your primary Cardiologist (physician) and Advanced Practice Providers (APPs -  Physician Assistants and Nurse Practitioners) who all work together to provide you with the care you need, when you  need it.  Your next appointment:   4 weeks on December 7  The format for your next appointment:   In Person  Provider:   You may see Dr. Elease HashimotoNahser or one of the following Advanced Practice Providers on your designated Care Team:    Tereso NewcomerScott Weaver, PA-C  Vin Honea PathBhagat, [place] JerseyPA-C  Berton BonJanine Hammond, [place]       Signed, Kristeen MissPhilip Nahser, MD  01/24/2019 5:56 PM    [facility]

## 2019-01-25 ENCOUNTER — Telehealth: Payer: Self-pay | Admitting: Pharmacist

## 2019-01-25 NOTE — Telephone Encounter (Signed)
Thank you for your help Megan.

## 2019-01-25 NOTE — Telephone Encounter (Signed)
Corlanor prior authorization approved through 01/24/2021, rx already sent to pharmacy at yesterday's office visit. If patient's copay is > $20, she would benefit from the Corlanor copay card.

## 2019-01-27 ENCOUNTER — Other Ambulatory Visit: Payer: Self-pay | Admitting: Family

## 2019-01-27 NOTE — Telephone Encounter (Signed)
Patient requesting refill for amitriptyline. Warning alerted with medication. Routing to provider for approval.

## 2019-01-27 NOTE — Telephone Encounter (Signed)
Received request from [facility] to change Corlanor to Toprol because it it covered by insurance.  Called and clarified with [facility] that the request was sent before PA was received.  The pharmacist states copay is $50.   To Dr. Elmarie Shiley nurse to give patient Corlanor copay card.

## 2019-01-29 ENCOUNTER — Other Ambulatory Visit: Payer: Self-pay | Admitting: Family

## 2019-01-29 DIAGNOSIS — R000 Tachycardia, unspecified: Secondary | ICD-10-CM

## 2019-01-31 NOTE — Progress Notes (Signed)
Virtual Visit via Video Note The purpose of this virtual visit is to provide medical care while limiting exposure to the novel coronavirus.    Consent was obtained for video visit:  Yes.   Answered questions that patient had about telehealth interaction:  Yes.   I discussed the limitations, risks, security and privacy concerns of performing an evaluation and management service by telemedicine. I also discussed with the patient that there may be a patient responsible charge related to this service. The patient expressed understanding and agreed to proceed.  Pt location: Home Physician Location: Home Name of referring provider:  Ngetich, Nelda Bucks, NP I connected with Hughie Closs at patients initiation/request on 02/02/2019 at  7:50 AM EST by video enabled telemedicine application and verified that I am speaking with the correct person using two identifiers. Pt MRN:  [id number] Pt DOB:  Feb 13, 1996 Video Participants:  Hughie Closs   History of Present Illness:  Virginia Kane is a 23 year old female with tachycardia, depression and anxiety who presents for migraines.  History supplemented by referring provider notes.  Onset:  Migraines since childhood.  Relatively well-controlled Location:  Holocephalic but primarily bitemporal Quality:  Pressure and throbbing Intensity:  If delays treatment, then 8/10; if treats early, then 6/10; denies new headache, thunderclap headache or severe headache that wakes  from sleep. Aura:  none Premonitory Phase:  none Postdrome:  Usually no Associated symptoms:  Nausea, vomiting, blurred vision, photophobia, phonophobia, osmophobia.  Rarely, vomiting or pain is so severe that she has passed out. She has had 3 episodes of left hemiplegic migraine (arm and leg) associated with left sided numbness (including face) with some slurred speech, that resolves by the next day.  Duration:  At least 8 hour Frequency:  4 to 5 times a month Frequency of abortive medication: 4  to 5 days a month Triggers:  Emotional stress, alcohol (certain beer and wine) Relieving factors:  Applying pressure to temples, sometimes sleep, early treatment with Excedrin. Activity:  aggravates   Current NSAIDS:  none Current analgesics:  Excedrin Migraine Current triptans:  none Current ergotamine:  none Current anti-emetic:  none Current muscle relaxants:  none Current anti-anxiolytic:  none Current sleep aide:  none Current Antihypertensive medications:  Metoprolol tartrate 12.5mg  twice daily (for sinus tachycardia) Current Antidepressant medications:  Amitriptyline 75mg  at bedtime Current Anticonvulsant medications:  None Current anti-CGRP:  none Current Vitamins/Herbal/Supplements:  MVI Current Antihistamines/Decongestants:  none Other therapy:  none Hormone/birth control:  [place]  Past NSAIDS:  Advil, naproxen Past analgesics:  Tylenol Past abortive triptans:  Sumatriptan tablet Past abortive ergotamine:  none Past muscle relaxants:  none Past anti-emetic:  none Past antihypertensive medications:  none Past antidepressant medications:  none Past anticonvulsant medications:  none Past anti-CGRP:  none Past vitamins/Herbal/Supplements:  none Past antihistamines/decongestants:  none Other past therapies:  none  Caffeine:  Coffee, energy drinks Diet:  Drinks 2 big water bottles daily Exercise:  Not routine Depression:  stable; Anxiety:  stable Other pain:  no Sleep hygiene:  good Family history of headache:  Mother (migraines) Other history:  Sinus tachycardia since age 80.  Has been evaluated by cardiology.  On metoprolol and Corlandor.  Echocardiogram scheduled.  01/14/2019 LABS: CBC with WBC 7.3, Hgb 13.9, HCT 42.4, PLT 368; CMP with NA 142, K4.6, CL 109, CO2 23, glucose 97, T bili 0.5, ALP 74, AST 19, ALT 36; TSH 1.31   Past Medical History: Past Medical History:  Diagnosis Date  . Anxiety   .  Depression   . Epistaxis   . Febrile seizure (HCC)    23  y.o.   . IBS (irritable bowel syndrome)   . Migraines   . Ovarian cyst   . Scoliosis   . Tachycardia     Medications: Outpatient Encounter Medications as of 02/02/2019  Medication Sig  . amitriptyline (ELAVIL) 75 MG tablet TAKE 1 TABLET BY MOUTH EVERY DAY AT NIGHT  . Aspirin-Acetaminophen-Caffeine (EXCEDRIN MIGRAINE PO) Patient takes 1 tab by mouth 4 times a month as needed  . ivabradine (CORLANOR) 5 MG TABS tablet Take 1 tablet (5 mg total) by mouth 2 (two) times daily with a meal.  . LARISSIA 0.1-20 MG-MCG tablet Take 1 tablet by mouth daily.  . metoprolol tartrate (LOPRESSOR) 25 MG tablet Take 0.5 tablets (12.5 mg total) by mouth 2 (two) times daily.  . Multiple Vitamins-Minerals (ONE-A-DAY WOMENS) tablet Take 1 tablet by mouth daily.   No facility-administered encounter medications on file as of 02/02/2019.     Allergies: No Known Allergies  Family History: Family History  Problem Relation Age of Onset  . Sjogren's syndrome Mother   . Anxiety disorder Mother   . Basal cell carcinoma Mother   . Autism Brother   . Anxiety disorder Brother   . Diabetes Maternal Grandmother   . Arthritis Maternal Grandmother   . Breast cancer Maternal Grandmother   . Skin cancer Maternal Grandmother     Social History: Social History   Socioeconomic History  . Marital status: Married    Spouse name: Not on file  . Number of children: Not on file  . Years of education: Not on file  . Highest education level: Not on file  Occupational History  . Not on file  Social Needs  . Financial resource strain: Not on file  . Food insecurity    Worry: Not on file    Inability: Not on file  . Transportation needs    Medical: Not on file    Non-medical: Not on file  Tobacco Use  . Smoking status: Never Smoker  . Smokeless tobacco: Never Used  Substance and Sexual Activity  . Alcohol use: Yes    Alcohol/week: 1.0 - 2.0 standard drinks    Types: 1 - 2 Standard drinks or equivalent per  week  . Drug use: Not on file  . Sexual activity: Not on file  Lifestyle  . Physical activity    Days per week: Not on file    Minutes per session: Not on file  . Stress: Not on file  Relationships  . Social Musicianconnections    Talks on phone: Not on file    Gets together: Not on file    Attends religious service: Not on file    Active member of club or organization: Not on file    Attends meetings of clubs or organizations: Not on file    Relationship status: Not on file  . Intimate partner violence    Fear of current or ex partner: Not on file    Emotionally abused: Not on file    Physically abused: Not on file    Forced sexual activity: Not on file  Other Topics Concern  . Not on file  Social History Narrative   Social History      Diet? regular      Do you drink/eat things with caffeine? yes      Marital status?           married  What year were you married? 2020      Do you live in a house, apartment, assisted living, condo, trailer, etc.?  house      Is it one or more stories? one      How many persons live in your home? 2       Do you have any pets in your home? (please list) 2 dog 2 lizards      Highest level of education completed? 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       no                               Type & how often? Need to more often       Advanced Directives      Do you have a living will? no      Do you have a DNR form?               no                   If not, do you want to discuss one?no       Do you have signed POA/HPOA for forms? No       Functional Status      Do you have difficulty bathing or dressing yourself? no      Do you have difficulty preparing food or eating? no      Do you have difficulty managing your medications? No       Do you have difficulty managing your finances? No       Do you have difficulty affording your medications? No         Observations/Objective:   Height 5\' 5"  (1.651 m), weight 145 lb (65.8 kg). No acute distress.  Alert and oriented.  Speech fluent and not dysarthric.  Language intact.  Eyes orthophoric on primary gaze.  Face symmetric.  Assessment and Plan:   1.  Migraine without aura, without status migrainosus, not intractable 2.  Hemiplegic migraine, not intractable  1.  For preventative management, we will increase amitriptyline to 100mg  at bedtime to try and further reduce migraine frequency 2.  For abortive therapy, will prescribe Ubrelvy 100mg .  She has failed over the counter analgesics (Excedrin, Advil, Aleve, Tylenol) and triptans are contraindicated due to having hemiplegic migraines. 3.  Limit use of pain relievers to no more than 2 days out of week to prevent risk of rebound or medication-overuse headache. 4.  Keep headache diary 5.  Exercise, hydration, caffeine cessation, sleep hygiene, monitor for and avoid triggers 6.  Consider:  magnesium citrate 400mg  daily, riboflavin 400mg  daily, and coenzyme Q10 100mg  three times daily 7. Follow up 5 months   Follow Up Instructions:    -I discussed the assessment and treatment plan with the patient. The patient was provided an opportunity to ask questions and all were answered. The patient agreed with the plan and demonstrated an understanding of the instructions.   The patient was advised to call back or seek an in-person evaluation if the symptoms worsen or if the condition fails to improve as anticipated.   , DO

## 2019-01-31 NOTE — Telephone Encounter (Signed)
Patient requesting refill routing to provider for approval due to warning associated with medication.

## 2019-02-01 ENCOUNTER — Other Ambulatory Visit: Payer: Self-pay

## 2019-02-01 ENCOUNTER — Ambulatory Visit (HOSPITAL_COMMUNITY): Payer: BC Managed Care – PPO | Attending: Cardiology

## 2019-02-01 DIAGNOSIS — R000 Tachycardia, unspecified: Secondary | ICD-10-CM | POA: Diagnosis not present

## 2019-02-02 ENCOUNTER — Encounter: Payer: Self-pay | Admitting: Neurology

## 2019-02-02 ENCOUNTER — Telehealth (INDEPENDENT_AMBULATORY_CARE_PROVIDER_SITE_OTHER): Payer: BC Managed Care – PPO | Admitting: Neurology

## 2019-02-02 VITALS — Ht 65.0 in | Wt 145.0 lb

## 2019-02-02 DIAGNOSIS — G43409 Hemiplegic migraine, not intractable, without status migrainosus: Secondary | ICD-10-CM

## 2019-02-02 DIAGNOSIS — G43009 Migraine without aura, not intractable, without status migrainosus: Secondary | ICD-10-CM

## 2019-02-02 MED ORDER — UBRELVY 100 MG PO TABS: 1.0000 | ORAL_TABLET | ORAL | 11 refills | Status: DC | PRN

## 2019-02-02 MED ORDER — AMITRIPTYLINE HCL 100 MG PO TABS: 100.0000 mg | ORAL_TABLET | Freq: Every day | ORAL | 4 refills | Status: DC

## 2019-02-02 NOTE — Patient Instructions (Signed)
Migraine Recommendations: 1.  Increase amitriptyline to 100mg  at bedtime 2.  Take Ubrelvy 100mg  at earliest onset of headache.  May repeat dose once in 2 hours if needed.  Do not exceed two tablets in 24 hours. 3.  Limit use of pain relievers to no more than 2 days out of the week.  These medications include acetaminophen, ibuprofen, triptans and narcotics.  This will help reduce risk of rebound headaches. 4.  Be aware of common food triggers such as processed sweets, processed foods with nitrites (such as deli meat, hot dogs, sausages), foods with MSG, alcohol (such as wine), chocolate, certain cheeses, certain fruits (dried fruits, bananas, pineapple), vinegar, diet soda. 4.  Avoid caffeine 5.  Routine exercise 6.  Proper sleep hygiene 7.  Stay adequately hydrated with water 8.  Keep a headache diary. 9.  Maintain proper stress management. 10.  Do not skip meals. 11.  Consider supplements:  Magnesium citrate 400mg  to 600mg  daily, riboflavin 400mg , Coenzyme Q 10 100mg  three times daily 12.  Follow up in 5 months.

## 2019-02-03 ENCOUNTER — Telehealth: Payer: Self-pay | Admitting: Cardiovascular Disease

## 2019-02-03 NOTE — Telephone Encounter (Signed)
Reviewed echo results with patient who verbalized understanding. She is grateful for the good report. She states Corlanor seems to be working well because her HR was lower at the time of her echo. She is going to come by to pick up a co-pay card to help with the cost of the medication. She thanked me for the call.

## 2019-02-03 NOTE — Progress Notes (Signed)
Roselyn Meier approved by [employer]

## 2019-02-03 NOTE — Telephone Encounter (Signed)
Follow Up: ° ° ° °Returning your call,concerning her Echo results.  °

## 2019-02-03 NOTE — Progress Notes (Signed)
[facility] (Key: [id number])  Rx #: [phone]  Roselyn Meier 100MG  tablets  prior authorization submitted via covermymeds.  Waiting response.

## 2019-02-04 ENCOUNTER — Telehealth: Payer: Self-pay | Admitting: Neurology

## 2019-02-04 NOTE — Telephone Encounter (Signed)
~  Virginia Kane  I believe you did PA form on this patient was this added to PA comments?

## 2019-02-04 NOTE — Telephone Encounter (Signed)
[employer] called and left msg with after hours that PA was denied for the Ubrelvy 100mg  and they will be faxing over an alternative. Thanks!

## 2019-02-04 NOTE — Telephone Encounter (Signed)
Per last office note on 02/02/19  Assessment and Plan:   1.  Migraine without aura, without status migrainosus, not intractable 2.  Hemiplegic migraine, not intractable  1.  For preventative management, we will increase amitriptyline to 100mg  at bedtime to try and further reduce migraine frequency 2.  For abortive therapy, will prescribe Ubrelvy 100mg .  She has failed over the counter analgesics (Excedrin, Advil, Aleve, Tylenol) and triptans are contraindicated due to having hemiplegic migraines.

## 2019-02-04 NOTE — Telephone Encounter (Signed)
Noted Will wait for fax regarding alternative medications

## 2019-02-04 NOTE — Telephone Encounter (Signed)
This info was in the PA.

## 2019-02-04 NOTE — Telephone Encounter (Signed)
Received hard fax from [facility] regarding PA approval for UBRELVY  DENIED  Unable to approve coverage because there was no documentation of the patient trying two step 1. Medication withing the previous 130 days. Step 1 medicaiton: naratriptam, rizatriptan, sumatriptan, almotriptan (PA required) frovatriptan, eletriptan,(PA required) sumatriptan/ naproxen (PA required) zolmitriptan, Zomig nasal spray (PA required)  Case# [id number]  Subscriber # [phone]  [phone]

## 2019-02-04 NOTE — Progress Notes (Signed)
Received hard fax from [employer] regarding PA approval for UBRELVY  DENIED  Unable to approve coverage because there was no documentation of the patient trying two step 1. Medication withing the previous 130 days. Step 1 medicaiton: naratriptam, rizatriptan, sumatriptan, almotriptan (PA required) frovatriptan, eletriptan,(PA required) sumatriptan/ naproxen (PA required) zolmitriptan, Zomig nasal spray (PA required)  Case# [id number]  Subscriber # [id number]  [phone]  

## 2019-02-21 ENCOUNTER — Ambulatory Visit: Payer: BC Managed Care – PPO | Admitting: Cardiovascular Disease

## 2019-02-26 ENCOUNTER — Other Ambulatory Visit: Payer: Self-pay | Admitting: Neurology

## 2019-03-04 ENCOUNTER — Ambulatory Visit: Payer: BC Managed Care – PPO | Admitting: Cardiovascular Disease

## 2019-03-24 ENCOUNTER — Telehealth: Payer: Self-pay

## 2019-03-24 ENCOUNTER — Telehealth: Payer: Self-pay | Admitting: Cardiovascular Disease

## 2019-03-24 NOTE — Telephone Encounter (Signed)
Patient has appt with Lizabeth Leyden tomorrow at 3:30pm. Due to weather, she would like her appt to be virtual. Please advise.

## 2019-03-24 NOTE — Progress Notes (Signed)
Virtual Visit via Video Note   This visit type was conducted due to national recommendations for restrictions regarding the COVID-19 Pandemic (e.g. social distancing) in an effort to limit this patient's exposure and mitigate transmission in our community.  Due to her co-morbid illnesses, this patient is at least at moderate risk for complications without adequate follow up.  This format is felt to be most appropriate for this patient at this time.  All issues noted in this document were discussed and addressed.  A limited physical exam was performed with this format.  Please refer to the patient's chart for her consent to telehealth for [facility].   Date:  03/25/2019   ID:  Hughie Closs, DOB 1996/03/17, MRN [id number]  Patient Location: Home Provider Location: Office  PCP:  Sandrea Hughs, NP  Cardiologist:  Mertie Moores, MD  Electrophysiologist:  None   Evaluation Performed:  Follow-Up Visit  Chief Complaint: Sinus tachycardia  History of Present Illness:    Virginia Kane is a 24 y.o. 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  Takes Elavil for migraines.  Tachycardia does not vary with her menstrual cycle.  Previous work-up for tachycardia is not available.  The patient was first evaluated by Dr. Acie Fredrickson on 01/24/2019.  He obtained an Echocardiogram on 02/01/2019 that showed EF 60-65%, no LVH, normal diastolic function, no evidence of valvular disease.  The patient has been treated with low-dose metoprolol but heart rate was remaining over 117.  At office visit on 01/24/2019 she was started on Corlanor 5 mg twice daily and continued on metoprolol.  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 She feels that the corlanor is working [facility], seems to be helping. Her resting HR has improved  ~86 bpm by apple watch.  Still raises some with climbing stairs, does not feel like pounding out of her chest like before.  She is  tolerating the medication well, however, she usually misses her evening doses.   She wants to start exercise now that holidays are over. She is drinking more water and trying to eat healthy.   The patient does not have symptoms concerning for COVID-19 infection (fever, chills, cough, or new shortness of breath).    Past Medical History:  Diagnosis Date  . Anxiety   . Depression   . Epistaxis   . Febrile seizure ([place])    24 y.o.   . IBS (irritable bowel syndrome)   . Migraines   . Ovarian cyst   . Scoliosis   . Tachycardia    Past Surgical History:  Procedure Laterality Date  . WISDOM TOOTH EXTRACTION     2017 [facility]      Current Meds  Medication Sig  . amitriptyline (ELAVIL) 100 MG tablet TAKE 1 TABLET BY MOUTH EVERYDAY AT BEDTIME  . Aspirin-Acetaminophen-Caffeine (EXCEDRIN MIGRAINE PO) Patient takes 1 tab by mouth 4 times a month as needed  . ivabradine (CORLANOR) 5 MG TABS tablet Take 5 mg by mouth daily.  Marland Kitchen LARISSIA 0.1-20 MG-MCG tablet Take 1 tablet by mouth daily.  . metoprolol tartrate (LOPRESSOR) 25 MG tablet Take 12.5 mg by mouth daily.  . Multiple Vitamins-Minerals (ONE-A-DAY WOMENS) tablet Take 1 tablet by mouth daily.  Marland Kitchen Ubrogepant (UBRELVY) 100 MG TABS Take 1 tablet by mouth as needed (May repeat dose after 2 hours if needed.  Maximum 2 tablets in 24 hours).     Allergies:  Patient has no known allergies.   Social History   Tobacco Use  . Smoking status: Never Smoker  . Smokeless tobacco: Never Used  Substance Use Topics  . Alcohol use: Yes    Alcohol/week: 1.0 - 2.0 standard drinks    Types: 1 - 2 Standard drinks or equivalent per week  . Drug use: Not on file     Family Hx: The patient's family history includes Anxiety disorder in her brother and mother; Arthritis in her maternal grandmother; Autism in her brother; Basal cell carcinoma in her mother; Breast cancer in her maternal grandmother; Diabetes in her maternal grandmother;  Sjogren's syndrome in her mother; Skin cancer in her maternal grandmother.  ROS:   Please see the history of present illness.     All other systems reviewed and are negative.   Prior CV studies:   The following studies were reviewed today:  Echocardiogram 02/01/2019 IMPRESSIONS  1. Left ventricular ejection fraction, by visual estimation, is 60 to 65%. The left ventricle has normal function. There is no left ventricular hypertrophy. Normal diastolic function.  2. Global right ventricle has normal systolic function.The right ventricular size is normal. No increase in right ventricular wall thickness.  3. Left atrial size was normal.  4. Right atrial size was normal.  5. The mitral valve is normal in structure. No evidence of mitral valve regurgitation.  6. The tricuspid valve is normal in structure. Tricuspid valve regurgitation is trivial.  7. The aortic valve was not well visualized. Aortic valve regurgitation is not visualized. No evidence of aortic valve sclerosis or stenosis.  8. The pulmonic valve was grossly normal. Pulmonic valve regurgitation is trivial.  9. The inferior vena cava is normal in size with greater than 50% respiratory variability, suggesting right atrial pressure of 3 mmHg. 10. TR signal is inadequate for assessing pulmonary artery systolic pressure.   Labs/Other Tests and Data Reviewed:    EKG:  An ECG dated 01/24/2019 was personally reviewed today and demonstrated:  Sinus tachycardia 117 bpm  Recent Labs: 01/14/2019: ALT 36; BUN 11; Creat 0.94; Hemoglobin 13.9; Platelets 368; Potassium 4.6; Sodium 142; TSH 1.31   Recent Lipid Panel Lab Results  Component Value Date/Time   CHOL 186 01/14/2019 08:13 AM   TRIG 70 01/14/2019 08:13 AM   HDL 44 (L) 01/14/2019 08:13 AM   CHOLHDL 4.2 01/14/2019 08:13 AM   LDLCALC 126 (H) 01/14/2019 08:13 AM    Wt Readings from Last 3 Encounters:  03/25/19 135 lb (61.2 kg)  02/02/19 145 lb (65.8 kg)  01/24/19 146 lb 12.8 oz  (66.6 kg)     Objective:    Vital Signs:  Ht 5\' 5"  (1.651 m)   Wt 135 lb (61.2 kg)   BMI 22.47 kg/m    VITAL SIGNS:  reviewed GEN:  no acute distress EYES:  sclerae anicteric, EOMI - Extraocular Movements Intact RESPIRATORY:  normal respiratory effort, symmetric expansion SKIN:  no rash, lesions or ulcers. NEURO:  alert and oriented x 3, no obvious focal deficit PSYCH:  normal affect  ASSESSMENT & PLAN:    1. Sinus tachycardia -Present since age 89.  Echocardiogram done in November showed normal LV function and valves. -Patient has been treated with low-dose metoprolol but heart rates were still in the 110's. -Patient was started on Corlanor 5 mg twice daily in November.  Patient reports decrease in resting heart rate into the 80s per her apple watch.  She is feeling better and not having the pounding in  her chest.  -She has only been taking her metoprolol and Corlanor in the mornings, usually missing the evening dose.  She says that she will try to take both doses as directed and see if it improves her heart rate even more. -She feels ready to start exercising.  We discussed exercising safely, using her breathing as a guide and not her heart rate since she is on heart rate lowering medications. -We will follow-up with Dr. Elease Hashimoto in 6 months unless needed sooner.  COVID-19 Education: The signs and symptoms of COVID-19 were discussed with the patient and how to seek care for testing (follow up with PCP or arrange E-visit).  The importance of social distancing was discussed today.  Time:   Today, I have spent 14 minutes with the patient with telehealth technology discussing the above problems.     Medication Adjustments/Labs and Tests Ordered: Current medicines are reviewed at length with the patient today.  Concerns regarding medicines are outlined above.   Tests Ordered: No orders of the defined types were placed in this encounter.   Medication Changes: No orders of the  defined types were placed in this encounter.   Follow Up:  Either In Person or Virtual in 6 month(s)  Signed, Berton Bon, NP  03/25/2019 4:00 PM    [facility]

## 2019-03-24 NOTE — Telephone Encounter (Signed)
YOUR CARDIOLOGY TEAM HAS ARRANGED FOR AN E-VISIT FOR YOUR APPOINTMENT - PLEASE REVIEW IMPORTANT INFORMATION BELOW SEVERAL DAYS PRIOR TO YOUR APPOINTMENT  Due to the recent COVID-19 pandemic, we are transitioning in-person office visits to tele-medicine visits in an effort to decrease unnecessary exposure to our patients, their families, and staff. These visits are billed to your insurance just like a normal visit is. We also encourage you to sign up for MyChart if you have not already done so. You will need a smartphone if possible. For patients that do not have this, we can still complete the visit using a regular telephone but do prefer a smartphone to enable video when possible. You may have a family member that lives with you that can help. If possible, we also ask that you have a blood pressure cuff and scale at home to measure your blood pressure, heart rate and weight prior to your scheduled appointment. Patients with clinical needs that need an in-person evaluation and testing will still be able to come to the office if absolutely necessary. If you have any questions, feel free to call our office.     YOUR PROVIDER WILL BE USING THE FOLLOWING PLATFORM TO COMPLETE YOUR VISIT: [employer]   . IF USING MYCHART - How to Download the MyChart App to Your SmartPhone   - If Apple, go to [employer] and type in MyChart in the search bar and download the app. If Android, ask patient to go to [employer] and type in [place] in the search bar and download the app. The app is free but as with any other app downloads, your phone may require you to verify saved payment information or Apple/Android password.  - You will need to then log into the app with your MyChart username and password, and select [facility] as your healthcare provider to link the account.  - When it is time for your visit, go to the MyChart app, find appointments, and click Begin Video Visit. Be sure to Select Allow for your device to  access the Microphone and Camera for your visit. You will then be connected, and your provider will be with you shortly.  **If you have any issues connecting or need assistance, please contact MyChart service desk [phone])**  **If using a computer, in order to ensure the best quality for your visit, you will need to use either of the following Internet Browsers: [employer] or [employer]**  . IF USING DOXIMITY or [email or web address].ME - The staff will give you instructions on receiving your link to join the meeting the day of your visit.      THE DAY OF YOUR APPOINTMENT  Approximately 15 minutes prior to your scheduled appointment, you will receive a telephone call from one of [facility] team - your caller ID may say "Unknown caller."  Our staff will confirm medications, vital signs for the day and any symptoms you may be experiencing. Please have this information available prior to the time of visit start. It may also be helpful for you to have a pad of paper and pen handy for any instructions given during your visit. They will also walk you through joining the smartphone meeting if this is a video visit.    CONSENT FOR TELE-HEALTH VISIT - PLEASE REVIEW  I hereby voluntarily request, consent and authorize [facility] and its employed or contracted physicians, physician assistants, nurse practitioners or other licensed health care professionals (the Practitioner), to provide me with telemedicine health  care services (the "Services") as deemed necessary by the treating Practitioner. I acknowledge and consent to receive the Services by the Practitioner via telemedicine. I understand that the telemedicine visit will involve communicating with the Practitioner through live audiovisual communication technology and the disclosure of certain medical information by electronic transmission. I acknowledge that I have been given the opportunity to request an in-person assessment or other  available alternative prior to the telemedicine visit and am voluntarily participating in the telemedicine visit.  I understand that I have the right to withhold or withdraw my consent to the use of telemedicine in the course of my care at any time, without affecting my right to future care or treatment, and that the Practitioner or I may terminate the telemedicine visit at any time. I understand that I have the right to inspect all information obtained and/or recorded in the course of the telemedicine visit and may receive copies of available information for a reasonable fee.  I understand that some of the potential risks of receiving the Services via telemedicine include:  Marland Kitchen Delay or interruption in medical evaluation due to technological equipment failure or disruption; . Information transmitted may not be sufficient (e.g. poor resolution of images) to allow for appropriate medical decision making by the Practitioner; and/or  . In rare instances, security protocols could fail, causing a breach of personal health information.  Furthermore, I acknowledge that it is my responsibility to provide information about my medical history, conditions and care that is complete and accurate to the best of my ability. I acknowledge that Practitioner's advice, recommendations, and/or decision may be based on factors not within their control, such as incomplete or inaccurate data provided by me or distortions of diagnostic images or specimens that may result from electronic transmissions. I understand that the practice of medicine is not an exact science and that Practitioner makes no warranties or guarantees regarding treatment outcomes. I acknowledge that I will receive a copy of this consent concurrently upon execution via email to the email address I last provided but may also request a printed copy by calling the office of [place].    I understand that my insurance will be billed for this visit.   I have  read or had this consent read to me. . I understand the contents of this consent, which adequately explains the benefits and risks of the Services being provided via telemedicine.  . I have been provided ample opportunity to ask questions regarding this consent and the Services and have had my questions answered to my satisfaction. . I give my informed consent for the services to be provided through the use of telemedicine in my medical care  By participating in this telemedicine visit I agree to the above.

## 2019-03-25 ENCOUNTER — Other Ambulatory Visit: Payer: Self-pay

## 2019-03-25 ENCOUNTER — Encounter: Payer: Self-pay | Admitting: Cardiology

## 2019-03-25 ENCOUNTER — Telehealth (INDEPENDENT_AMBULATORY_CARE_PROVIDER_SITE_OTHER): Payer: BC Managed Care – PPO | Admitting: Cardiology

## 2019-03-25 VITALS — Ht 65.0 in | Wt 135.0 lb

## 2019-03-25 DIAGNOSIS — R000 Tachycardia, unspecified: Secondary | ICD-10-CM | POA: Diagnosis not present

## 2019-03-25 NOTE — Patient Instructions (Addendum)
Medication Instructions:   Your physician recommends that you continue on your current medications as directed. Please refer to the Current Medication list given to you today.  1) Take your Metoprolol 25MG , 0.5 tablet (12.5MG ) by mouth twice a day 2) Take your Corlanor 5MG , 1 tablet by mouth twice a day  *If you need a refill on your cardiac medications before your next appointment, please call your pharmacy*  Lab Work:  None ordered today  If you have labs (blood work) drawn today and your tests are completely normal, you will receive your results only by: MyChart Message (if you have MyChart) OR . A paper copy in the mail If you have any lab test that is abnormal or we need to change your treatment, we will call you to review the results.  Testing/Procedures:  None ordered today  Follow-Up: At [facility], you and your health needs are our priority.  As part of our continuing mission to provide you with exceptional heart care, we have created designated Provider Care Teams.  These Care Teams include your primary Cardiologist (physician) and Advanced Practice Providers (APPs -  Physician Assistants and Nurse Practitioners) who all work together to provide you with the care you need, when you need it.  Your next appointment:   6 month(s)  The format for your next appointment:   In Person  Provider:   You may see Marland Kitchen, MD or one of the following Advanced Practice Providers on your designated Care Team:    [facility], PA-C  Vin Esmont, Tereso Newcomer  [place], NP    Lifestyle Modifications to Prevent and Treat Heart Disease -Recommend heart healthy/Mediterranean diet, with whole grains, fruits, vegetables, fish, lean meats, nuts, olive oil and avocado oil.  -Limit salt intake to less than 2000 mg per day.  -Recommend moderate walking, starting slowly with a few minutes and working up to 3-5 times/week for 30-50 minutes each session. Aim for at least 150  minutes.week. Goal should be pace of 3 miles/hours, or walking 1.5 miles in 30 minutes -Recommend avoidance of tobacco products. Avoid excess alcohol. -Keep blood pressure well controlled, ideally less than 130/80.

## 2019-04-25 DIAGNOSIS — Z124 Encounter for screening for malignant neoplasm of cervix: Secondary | ICD-10-CM | POA: Diagnosis not present

## 2019-04-25 DIAGNOSIS — Z130 Encounter for screening for diseases of the blood and blood-forming organs and certain disorders involving the immune mechanism: Secondary | ICD-10-CM | POA: Diagnosis not present

## 2019-04-25 DIAGNOSIS — Z01419 Encounter for gynecological examination (general) (routine) without abnormal findings: Secondary | ICD-10-CM | POA: Diagnosis not present

## 2019-04-25 DIAGNOSIS — N898 Other specified noninflammatory disorders of vagina: Secondary | ICD-10-CM | POA: Diagnosis not present

## 2019-04-25 DIAGNOSIS — Z6825 Body mass index (BMI) 25.0-25.9, adult: Secondary | ICD-10-CM | POA: Diagnosis not present

## 2019-05-01 LAB — HM PAP SMEAR: HM Pap smear: NORMAL

## 2019-05-09 ENCOUNTER — Other Ambulatory Visit: Payer: Self-pay | Admitting: Family

## 2019-05-09 NOTE — Telephone Encounter (Signed)
This has never been filled here before, not to sure about birth control, please advise

## 2019-07-04 ENCOUNTER — Ambulatory Visit: Payer: BC Managed Care – PPO | Admitting: Neurology

## 2019-07-22 ENCOUNTER — Ambulatory Visit (INDEPENDENT_AMBULATORY_CARE_PROVIDER_SITE_OTHER): Payer: BC Managed Care – PPO | Admitting: Family

## 2019-07-22 ENCOUNTER — Other Ambulatory Visit: Payer: Self-pay

## 2019-07-22 ENCOUNTER — Encounter: Payer: Self-pay | Admitting: Family

## 2019-07-22 VITALS — BP 118/70 | HR 92 | Temp 97.5°F | Resp 16 | Ht 65.0 in | Wt 156.0 lb

## 2019-07-22 DIAGNOSIS — F411 Generalized anxiety disorder: Secondary | ICD-10-CM | POA: Diagnosis not present

## 2019-07-22 DIAGNOSIS — K582 Mixed irritable bowel syndrome: Secondary | ICD-10-CM

## 2019-07-22 DIAGNOSIS — G43109 Migraine with aura, not intractable, without status migrainosus: Secondary | ICD-10-CM

## 2019-07-22 DIAGNOSIS — R000 Tachycardia, unspecified: Secondary | ICD-10-CM

## 2019-07-22 DIAGNOSIS — F339 Major depressive disorder, recurrent, unspecified: Secondary | ICD-10-CM

## 2019-07-22 NOTE — Patient Instructions (Signed)
continue current medication

## 2019-07-24 NOTE — Progress Notes (Signed)
Provider: Marlowe Sax FNP-C   Ngetich, Nelda Bucks, NP  Patient Care Team: Ngetich, Nelda Bucks, NP as PCP - General (Family Medicine) Nahser, Wonda Cheng, MD as PCP - Cardiology (Cardiology)  Extended Emergency Contact Information Primary Emergency Contact: Kirsi, Hugh Mobile Phone: [phone] Relation: Spouse  Code Status: Full Code  Goals of care: Advanced Directive information Advanced Directives 07/22/2019  Does Patient Have a Medical Advance Directive? No  Would patient like information on creating a medical advance directive? -     Chief Complaint  Patient presents with  . Medication Management    6 Month Follow Up.    HPI:  Pt is a 24 y.o. female seen today for 6 months follow up for medical management of chronic diseases.she denies any acute issues.Has completed her two doses of COVID-19 vaccine.Had [place] no side effects.States has been able to do work out exercises without having palpitation.she continue to monitor her heart rate using her apple watch.she was seen by Cardiologist  Dr.Nahser Doren Custard 01/24/2019 was started on Corlanor 5 mg twice daily and Metoprolol 12.5 mg tablet was continued.Alos had an ECHO 02/01/2020 which showed EF 60-65% no LVH,normal diastolic function and no evidence of valvular disease. She also had a follow up visit with Cardiology Alex Gardener no change in medication.she was advised to exercising safely using her breathing as a guide and not her heart rate since she is on heart rate lower medication.she continue to exercise with caution.Still planning for pregnancy possible in the summer or fall. She has been trying to eat more healthy and drinking plenty of water.   Migraine headache - not getting them as often.Has not had any hemiplegic of the left arm and leg or numbness of the face.Follows with Neurologist Dr.Jaffe Adam last seen 02/02/2019 her amitriptyline was increased to 100 mg tablet at bedtime to reduce migraine frequency.She was also  started on ubrelvy 100 mg tablet for abortive therapy.she was also encouraged to keep headache diary,exercise,hydrate,caffeine cessation,sleep hygiene and to avoid migraine triggers.Also recommended magnesium citrate 400 mg daily,riboflavin 400 mg daily and coenzyme Q10 100 mg three times daily and advised to follow up in 5 months.she states has upcoming follow up appointment with Neurologist.on Excedrin tablet four times per month as needed.    Depression/Anxiety - states symptoms are stable on Amitriptyline 100 mg tablet daily.Work out exercises has helped.   Irritable bowel syndrome - reports symptoms under controlled.  She is due for a pap smear but states was done by [facility]. No records for reviewed.    Past Medical History:  Diagnosis Date  . Anxiety   . Depression   . Epistaxis   . Febrile seizure ([place])    24 y.o.   . IBS (irritable bowel syndrome)   . Migraines   . Ovarian cyst   . Scoliosis   . Tachycardia    Past Surgical History:  Procedure Laterality Date  . WISDOM TOOTH EXTRACTION     2017 [facility] Facial Surgery     No Known Allergies  Allergies as of 07/22/2019   No Known Allergies     Medication List       Accurate as of Jul 22, 2019 11:59 PM. If you have any questions, ask your nurse or doctor.        amitriptyline 100 MG tablet Commonly known as: ELAVIL TAKE 1 TABLET BY MOUTH EVERYDAY AT BEDTIME   Corlanor 5 MG Tabs tablet Generic drug: ivabradine Take 5 mg by mouth in the morning and  at bedtime.   EXCEDRIN MIGRAINE PO Patient takes 1 tab by mouth 4 times a month as needed   Larissia 0.1-20 MG-MCG tablet Generic drug: levonorgestrel-ethinyl estradiol TAKE 1 TABLET BY MOUTH EVERY DAY AS DIRECTED   metoprolol tartrate 25 MG tablet Commonly known as: LOPRESSOR Take 12.5 mg by mouth in the morning and at bedtime.   One-A-Day Womens tablet Take 1 tablet by mouth daily.   Ubrelvy 100 MG Tabs Generic drug: Ubrogepant Take 1 tablet by  mouth as needed (May repeat dose after 2 hours if needed.  Maximum 2 tablets in 24 hours).       Review of Systems  Constitutional: Negative for appetite change, chills, fatigue and fever.  HENT: Negative for congestion, rhinorrhea, sinus pressure, sinus pain, sneezing, sore throat and trouble swallowing.   Eyes: Positive for visual disturbance. Negative for discharge, redness and itching.       Wears contacts   Respiratory: Negative for cough, chest tightness, shortness of breath and wheezing.   Cardiovascular: Negative for chest pain, palpitations and leg swelling.  Gastrointestinal: Negative for abdominal distention, abdominal pain, nausea and vomiting.       IBS  Endocrine: Negative for cold intolerance, heat intolerance, polydipsia, polyphagia and polyuria.  Genitourinary: Negative for difficulty urinating, dysuria, flank pain, frequency and urgency.  Musculoskeletal: Negative for arthralgias, gait problem and myalgias.  Skin: Negative for color change, pallor and rash.  Neurological: Positive for headaches. Negative for dizziness, speech difficulty, weakness, light-headedness and numbness.  Hematological: Does not bruise/bleed easily.  Psychiatric/Behavioral: Negative for agitation, confusion and sleep disturbance. The patient is not nervous/anxious.     Immunization History  Administered Date(s) Administered  . DTaP 11/25/1993, 01/24/1994, 03/26/1994, 04/03/1995, 10/02/1998  . Hepatitis B 03/21/93, 10/24/1993, 06/25/1994  . HiB (PRP-OMP) 11/25/1993, 01/24/1994, 03/26/1994, 01/06/1995  . Hpv 10/17/2005, 01/01/2006, 08/27/2006  . IPV 11/25/1993, 01/24/1994, 10/02/1998  . Influenza,inj,Quad PF,6+ Mos 12/10/2018  . Influenza-Unspecified 02/23/2014, 01/16/2015, 01/15/2016  . MMR 01/06/1995, 10/02/1998  . Meningococcal Conjugate 01/01/2006  . Meningococcal Polysaccharide 09/04/2011  . PFIZER SARS-COV-2 Vaccination 03/26/2019, 04/15/2019  . Td 11/17/2005, 02/22/2016  . Tdap  02/22/2016  . Varicella 10/01/1994, 10/06/2008   Pertinent  Health Maintenance Due  Topic Date Due  . PAP SMEAR-Modifier  Never done  . INFLUENZA VACCINE  10/16/2019   Fall Risk  07/22/2019 02/02/2019  Falls in the past year? 0 0  Number falls in past yr: 0 0  Injury with Fall? 0 0    Vitals:   07/22/19 1551  BP: 118/70  Pulse: 92  Resp: 16  Temp: (!) 97.5 F (36.4 C)  SpO2: 98%  Weight: 156 lb (70.8 kg)  Height: 5' 5"  (1.651 m)   Body mass index is 25.96 kg/m.   Physical Exam Vitals reviewed.  Constitutional:      General: She is not in acute distress.    Appearance: She is overweight. She is not ill-appearing.  HENT:     Head: Normocephalic.     Right Ear: Tympanic membrane, ear canal and external ear normal. There is no impacted cerumen.     Left Ear: Tympanic membrane, ear canal and external ear normal. There is no impacted cerumen.     Nose: Nose normal. No congestion or rhinorrhea.     Mouth/Throat:     Mouth: Mucous membranes are moist.     Pharynx: Oropharynx is clear. No oropharyngeal exudate or posterior oropharyngeal erythema.  Eyes:     General: No scleral icterus.  Right eye: No discharge.        Left eye: No discharge.     Extraocular Movements: Extraocular movements intact.     Conjunctiva/sclera: Conjunctivae normal.     Pupils: Pupils are equal, round, and reactive to light.  Neck:     Vascular: No carotid bruit.  Cardiovascular:     Rate and Rhythm: Regular rhythm.     Pulses: Normal pulses.     Heart sounds: Normal heart sounds. No murmur. No friction rub. No gallop.      Comments: HR 92 b/min Apical  Pulmonary:     Effort: Pulmonary effort is normal. No respiratory distress.     Breath sounds: Normal breath sounds. No wheezing, rhonchi or rales.  Chest:     Chest wall: No tenderness.  Abdominal:     General: Bowel sounds are normal. There is no distension.     Palpations: Abdomen is soft. There is no mass.     Tenderness: There is  no abdominal tenderness. There is no right CVA tenderness, left CVA tenderness, guarding or rebound.  Musculoskeletal:        General: No swelling or tenderness. Normal range of motion.     Cervical back: Normal range of motion. No rigidity or tenderness.     Right lower leg: No edema.     Left lower leg: No edema.  Lymphadenopathy:     Cervical: No cervical adenopathy.  Skin:    General: Skin is warm and dry.     Coloration: Skin is not pale.     Findings: No bruising, erythema, lesion or rash.  Neurological:     Mental Status: She is oriented to person, place, and time.     Cranial Nerves: No cranial nerve deficit.     Sensory: No sensory deficit.     Motor: No weakness.     Coordination: Coordination normal.     Gait: Gait normal.  Psychiatric:        Mood and Affect: Mood normal.        Speech: Speech normal.        Behavior: Behavior normal.        Thought Content: Thought content normal.        Judgment: Judgment normal.    Labs reviewed: Recent Labs    01/14/19 0813  NA 142  K 4.6  CL 109  CO2 23  GLUCOSE 97  BUN 11  CREATININE 0.94  CALCIUM 9.4   Recent Labs    01/14/19 0813  AST 19  ALT 36*  BILITOT 0.5  PROT 6.8   Recent Labs    01/14/19 0813  WBC 7.3  NEUTROABS 3,584  HGB 13.9  HCT 42.4  MCV 86.5  PLT 368   Lab Results  Component Value Date   TSH 1.31 01/14/2019   No results found for: HGBA1C Lab Results  Component Value Date   CHOL 186 01/14/2019   HDL 44 (L) 01/14/2019   LDLCALC 126 (H) 01/14/2019   TRIG 70 01/14/2019   CHOLHDL 4.2 01/14/2019    Significant Diagnostic Results in last 30 days:  No results found.  Assessment/Plan 1. Tachycardia HR 92 b/min much improvement compared to previous visits.Asymptomatic. - continue Corlanor 5 mg tablet twice daily and Metoprolol 12.5 mg tablet twice daily.  - continue to follow up with Cardiologist.   2. Migraine with aura and without status migrainosus, not intractable Stable. -  continue on amitriptyline 100 mg tablet at bedtime,Excedrin one by  mouth four times a month as needed and ubrelvy 100 mg tablet for abortive therapy. - encouraged to increase water intake to 6-8 glasses daily  - continue to keep headache diary, - continue with Exercise, - Avoid caffeine cessation - Advised sleep hygiene and avoid migraine triggers - continue to follow up with Neurologist as directed.  3. Irritable bowel syndrome with both constipation and diarrhea Stable.continue to monitor.  4. Generalized anxiety disorder Stable.   5. Recurrent major depressive disorder, remission status unspecified (HCC) Mood stable.continue on amitriptyline 100 mg tablet at bedtime.  Family/ staff Communication: Reviewed plan of care with patient verbalized understanding.  Labs/tests ordered: labs in 6 months with Physical.  Next Appointment : 6 months for annual Physical Examination.   Sandrea Hughs, NP

## 2019-08-02 ENCOUNTER — Other Ambulatory Visit: Payer: Self-pay | Admitting: Family

## 2019-08-02 DIAGNOSIS — R000 Tachycardia, unspecified: Secondary | ICD-10-CM

## 2019-08-22 NOTE — Progress Notes (Signed)
Virtual Visit via Video Note The purpose of this virtual visit is to provide medical care while limiting exposure to the novel coronavirus.    Consent was obtained for video visit:  Yes.   Answered questions that patient had about telehealth interaction:  Yes.   I discussed the limitations, risks, security and privacy concerns of performing an evaluation and management service by telemedicine. I also discussed with the patient that there may be a patient responsible charge related to this service. The patient expressed understanding and agreed to proceed.  Pt location: Home Physician Location: office Name of referring provider:  Ngetich, Donalee Citrin, NP I connected with Joyce Gross at patients initiation/request on 08/24/2019 at  8:30 AM EDT by video enabled telemedicine application and verified that I am speaking with the correct person using two identifiers. Pt MRN:  [id number] Pt DOB:  10-20-1995 Video Participants:  Joyce Gross   History of Present Illness:  Virginia Kane is a 24 year old female with tachycardia, depression and anxiety who follows up for hemiplegic migraine.  UPDATE: Increased amitriptyline in November.   Ubrelvy usually helps.  Migraines have been so infrequent, she hasn't been tracking them.  When she feels a migraine starting, she takes [place] and it quickly aborts, so they never progress to severe.  Usually once a month.  Hemiplegic migraines occur less frequent.   She had a severe migraine last night.  Ubrelvy didn't help last night.  She attributes it to a stressful week.   She plans to start trying to get pregnant next month. Current NSAIDS:  none Current analgesics:  none Current triptans:  none Current ergotamine:  none Current anti-emetic:  none Current muscle relaxants:  none Current anti-anxiolytic:  none Current sleep aide:  none Current Antihypertensive medications:  Metoprolol tartrate 12.5mg  twice daily (for sinus tachycardia) Current Antidepressant  medications:  Amitriptyline 100mg  at bedtime Current Anticonvulsant medications:  None Current anti-CGRP:  Ubrelvy 100mg  Current Vitamins/Herbal/Supplements:  MVI Current Antihistamines/Decongestants:  none Other therapy:  none Hormone/birth control:  Larissia  Caffeine:  Coffee, energy drinks Diet:  Drinks 2 big water bottles daily Exercise:  Not routine Depression:  stable; Anxiety:  stable Other pain:  no Sleep hygiene:  good  HISTORY: Onset:  Migraines since childhood.  Relatively well-controlled Location:  Holocephalic but primarily bitemporal Quality:  Pressure and throbbing Initial intensity:  If delays treatment, then 8/10; if treats early, then 6/10; denies new headache, thunderclap headache or severe headache that wakes  from sleep. Aura:  none Premonitory Phase:  none Postdrome:  Usually no Associated symptoms:  Nausea, vomiting, blurred vision, photophobia, phonophobia, osmophobia.  Rarely, vomiting or pain is so severe that she has passed out. She has had 3 episodes of left hemiplegic migraine (arm and leg) associated with left sided numbness (including face) with some slurred speech, that resolves by the next day.  Initial duration:  At least 8 hour Initial frequency:  4 to 5 times a month Initial frequency of abortive medication: 4 to 5 days a month Triggers:  Emotional stress, alcohol (certain beer and wine) Relieving factors:  Applying pressure to temples, sometimes sleep, early treatment with Excedrin. Activity:  aggravates   Past NSAIDS:  Advil, naproxen Past analgesics:  Tylenol; Excedrin Migraine Past abortive triptans:  Sumatriptan tablet Past abortive ergotamine:  none Past muscle relaxants:  none Past anti-emetic:  none Past antihypertensive medications:  none Past antidepressant medications:  none Past anticonvulsant medications:  none Past anti-CGRP:  none Past vitamins/Herbal/Supplements:  none Past antihistamines/decongestants:  none Other  past therapies:  none   Family history of headache:  Mother (migraines) Other history:  Sinus tachycardia since age 25.  Has been evaluated by cardiology.  On metoprolol and Corlandor.  Echocardiogram scheduled.  PAST MEDICAL HISTORY: Past Medical History:  Diagnosis Date  . Anxiety   . Depression   . Epistaxis   . Febrile seizure (HCC)    24 y.o.   . IBS (irritable bowel syndrome)   . Migraines   . Ovarian cyst   . Scoliosis   . Tachycardia     MEDICATIONS: Current Outpatient Medications on File Prior to Visit  Medication Sig Dispense Refill  . amitriptyline (ELAVIL) 100 MG tablet TAKE 1 TABLET BY MOUTH EVERYDAY AT BEDTIME 90 tablet 2  . Aspirin-Acetaminophen-Caffeine (EXCEDRIN MIGRAINE PO) Patient takes 1 tab by mouth 4 times a month as needed    . ivabradine (CORLANOR) 5 MG TABS tablet Take 5 mg by mouth in the morning and at bedtime.     Marland Kitchen LARISSIA 0.1-20 MG-MCG tablet TAKE 1 TABLET BY MOUTH EVERY DAY AS DIRECTED 84 tablet 2  . metoprolol tartrate (LOPRESSOR) 25 MG tablet TAKE 0.5 TABLETS (12.5 MG TOTAL) BY MOUTH 2 (TWO) TIMES DAILY. 90 tablet 1  . Multiple Vitamins-Minerals (ONE-A-DAY WOMENS) tablet Take 1 tablet by mouth daily.    Marland Kitchen Ubrogepant (UBRELVY) 100 MG TABS Take 1 tablet by mouth as needed (May repeat dose after 2 hours if needed.  Maximum 2 tablets in 24 hours). 10 tablet 11   No current facility-administered medications on file prior to visit.    ALLERGIES: No Known Allergies  FAMILY HISTORY: Family History  Problem Relation Age of Onset  . Sjogren's syndrome Mother   . Anxiety disorder Mother   . Basal cell carcinoma Mother   . Autism Brother   . Anxiety disorder Brother   . Diabetes Maternal Grandmother   . Arthritis Maternal Grandmother   . Breast cancer Maternal Grandmother   . Skin cancer Maternal Grandmother     SOCIAL HISTORY: Social History   Socioeconomic History  . Marital status: Married    Spouse name: Not on file  . Number of  children: 0  . Years of education: Not on file  . Highest education level: Master's degree (e.g., MA, MS, MEng, MEd, MSW, MBA)  Occupational History  . Not on file  Tobacco Use  . Smoking status: Never Smoker  . Smokeless tobacco: Never Used  Substance and Sexual Activity  . Alcohol use: Yes    Alcohol/week: 1.0 - 2.0 standard drinks    Types: 1 - 2 Standard drinks or equivalent per week  . Drug use: Never  . Sexual activity: Not on file  Other Topics Concern  . Not on file  Social History Narrative   Social History      Diet? regular      Do you drink/eat things with caffeine? yes      Marital status?           married                         What year were you married? 2020      Do you live in a house, apartment, assisted living, condo, trailer, etc.?  house      Is it one or more stories? one      How many persons live in your home? 2  Do you have any pets in your home? (please list) 2 dog 2 lizards      Highest level of education completed? 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       no                               Type & how often? Need to more often       Advanced Directives      Do you have a living will? no      Do you have a DNR form?               no                   If not, do you want to discuss one?no       Do you have signed POA/HPOA for forms? No       Functional Status      Do you have difficulty bathing or dressing yourself? no      Do you have difficulty preparing food or eating? no      Do you have difficulty managing your medications? No       Do you have difficulty managing your finances? No       Do you have difficulty affording your medications? No       Social Determinants of Health   Financial Resource Strain:   . Difficulty of Paying Living Expenses:   Food Insecurity:   . Worried About Programme researcher, broadcasting/film/video in the Last Year:   . Barista in the Last Year:     Transportation Needs:   . Freight forwarder (Medical):   Marland Kitchen Lack of Transportation (Non-Medical):   Physical Activity:   . Days of Exercise per Week:   . Minutes of Exercise per Session:   Stress:   . Feeling of Stress :   Social Connections:   . Frequency of Communication with Friends and Family:   . Frequency of Social Gatherings with Friends and Family:   . Attends Religious Services:   . Active Member of Clubs or Organizations:   . Attends Banker Meetings:   Marland Kitchen Marital Status:   Intimate Partner Violence:   . Fear of Current or Ex-Partner:   . Emotionally Abused:   Marland Kitchen Physically Abused:   . Sexually Abused:     Objective: Height 5\' 5"  (1.651 m), weight 150 lb (68 kg).  Impression & Plan: Migraine without aura, without status migrainosus, not intractable Hemiplegic migraine, without status migrainosus, not intractable She is soon going to start trying to get pregnant.  I told her that she would have to get off of amitriptyline but advised that she contact her OBGYN about whether she should stop now or if she can wait until she finds out that she is pregnant.  I informed her that typically migraines improve in pregnancy.  1.  For preventative management, amitriptyline 100mg  at bedtime.  I advised her to contact her OBGYN to see if she should stop amitriptyline now or if she can wait until she finds out that she is pregnant.  In the meantime, I recommended that she go ahead and start magnesium oxide 400mg  daily and riboflavin 400mg  daily. 2.  For abortive therapy, Ubrelvy.  If she gets pregnant, I  told her that she would have to discontinue.  Her insurance has denied [place].  She will come by the office for samples and copay card.   3.  Limit use of pain relievers to no more than 2 days out of week to prevent risk of rebound or medication-overuse headache. 4.  Keep headache diary 5.  Exercise, hydration, caffeine cessation, sleep hygiene, monitor for and avoid  triggers 6. Follow up 6 months.  Follow Up Instructions:    -I discussed the assessment and treatment plan with the patient. The patient was provided an opportunity to ask questions and all were answered. The patient agreed with the plan and demonstrated an understanding of the instructions.   The patient was advised to call back or seek an in-person evaluation if the symptoms worsen or if the condition fails to improve as anticipated.   Dudley Major, DO

## 2019-08-24 ENCOUNTER — Encounter: Payer: Self-pay | Admitting: Neurology

## 2019-08-24 ENCOUNTER — Other Ambulatory Visit: Payer: Self-pay

## 2019-08-24 ENCOUNTER — Telehealth (INDEPENDENT_AMBULATORY_CARE_PROVIDER_SITE_OTHER): Payer: BC Managed Care – PPO | Admitting: Neurology

## 2019-08-24 VITALS — Ht 65.0 in | Wt 150.0 lb

## 2019-08-24 DIAGNOSIS — G43409 Hemiplegic migraine, not intractable, without status migrainosus: Secondary | ICD-10-CM

## 2019-08-24 DIAGNOSIS — G43009 Migraine without aura, not intractable, without status migrainosus: Secondary | ICD-10-CM

## 2019-09-22 ENCOUNTER — Telehealth: Payer: Self-pay | Admitting: Cardiovascular Disease

## 2019-09-22 MED ORDER — LABETALOL HCL 100 MG PO TABS
50.0000 mg | ORAL_TABLET | Freq: Two times a day (BID) | ORAL | 3 refills | Status: DC
Start: 2019-09-22 — End: 2020-10-19

## 2019-09-22 NOTE — Telephone Encounter (Signed)
Transferred call to Pam  

## 2019-09-22 NOTE — Telephone Encounter (Signed)
Left message for patient to call back  

## 2019-09-22 NOTE — Telephone Encounter (Signed)
  Patient called back. Informed patient of Dr. Harvie Bridge recommendations. Patient will stop corlanor and metoprolol. Patient will start labetalol 50 mg BID. Sent to patient's pharmacy of choice. Informed patient to make sure she stays hydrated and is eating plenty of protein. Informed her that she may need to salt her food a bit more than usual. Made patient an appointment on 10/03/19 to see Dr. Elease Hashimoto.

## 2019-10-02 ENCOUNTER — Encounter: Payer: Self-pay | Admitting: Cardiovascular Disease

## 2019-10-02 NOTE — Progress Notes (Signed)
Cardiology Office Note:    Date:  10/03/2019   ID:  Virginia Kane, DOB [birth year], MRN [id number]  PCP:  Caesar Bookman, NP  Cardiologist:  Matix Henshaw  Electrophysiologist:  None   Referring MD: Caesar Bookman, NP   Chief Complaint  Patient presents with   Tachycardia     History of Present Illness:    Virginia Kane is a 24 y.o. female with a hx of sinus tach Has had since age 21  Has had echo in the past  Does not do cardio because she is scared.  Sleeps well  Does not exercise as much  Takes Elavil for migraines Tachycardia does not vary with her menstral cycle.   easts and drinks regularly .   previous work up is not available    October 03, 2019  Virginia Kane is seen today for follow up of her sinus tachycardia She has been well controlled on metoprolol and corlanor for the past year but now she and her husband are planning on becoming pregnant. Corlanor has been stopped and we have started Labetolol 50 mg BID  She feels well  HR was very well controlled on the corlanol and metoprolol combo.  Still fairly well controlled.    Past Medical History:  Diagnosis Date   Anxiety    Depression    Epistaxis    Febrile seizure (HCC)    24 y.o.    IBS (irritable bowel syndrome)    Migraines    Ovarian cyst    Scoliosis    Tachycardia     Past Surgical History:  Procedure Laterality Date   WISDOM TOOTH EXTRACTION     2017 [facility]     Current Medications: Current Meds  Medication Sig   amitriptyline (ELAVIL) 100 MG tablet TAKE 1 TABLET BY MOUTH EVERYDAY AT BEDTIME   Aspirin-Acetaminophen-Caffeine (EXCEDRIN MIGRAINE PO) Patient takes 1 tab by mouth 4 times a month as needed   labetalol (NORMODYNE) 100 MG tablet Take 0.5 tablets (50 mg total) by mouth 2 (two) times daily.   Prenatal MV & Min w/FA-DHA (PRENATAL ADULT GUMMY/DHA/FA PO) Take 2 tablets by mouth daily.    Ubrogepant (UBRELVY) 100 MG TABS Take 1 tablet by mouth as needed (May repeat  dose after 2 hours if needed.  Maximum 2 tablets in 24 hours).     Allergies:   Patient has no known allergies.   Social History   Socioeconomic History   Marital status: Married    Spouse name: Not on file   Number of children: 0   Years of education: Not on file   Highest education level: Master's degree (e.g., MA, MS, MEng, MEd, MSW, MBA)  Occupational History   Not on file  Tobacco Use   Smoking status: Never Smoker   Smokeless tobacco: Never Used  Vaping Use   Vaping Use: Never used  Substance and Sexual Activity   Alcohol use: Yes    Alcohol/week: 1.0 - 2.0 standard drink    Types: 1 - 2 Standard drinks or equivalent per week   Drug use: Never   Sexual activity: Not on file  Other Topics Concern   Not on file  Social History Narrative   Social History      Diet? regular      Do you drink/eat things with caffeine? yes      Marital status?           married  What year were you married? 2020      Do you live in a house, apartment, assisted living, condo, trailer, etc.?  house      Is it one or more stories? one      How many persons live in your home? 2       Do you have any pets in your home? (please list) 2 dog 2 lizards      Highest level of education completed? 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       no                               Type & how often? Need to more often       Advanced Directives      Do you have a living will? no      Do you have a DNR form?               no                   If not, do you want to discuss one?no       Do you have signed POA/HPOA for forms? No       Functional Status      Do you have difficulty bathing or dressing yourself? no      Do you have difficulty preparing food or eating? no      Do you have difficulty managing your medications? No       Do you have difficulty managing your finances? No       Do you have  difficulty affording your medications? No       Social Determinants of Corporate investment banker Strain:    Difficulty of Paying Living Expenses:   Food Insecurity:    Worried About Programme researcher, broadcasting/film/video in the Last Year:    Barista in the Last Year:   Transportation Needs:    Freight forwarder (Medical):    Lack of Transportation (Non-Medical):   Physical Activity:    Days of Exercise per Week:    Minutes of Exercise per Session:   Stress:    Feeling of Stress :   Social Connections:    Frequency of Communication with Friends and Family:    Frequency of Social Gatherings with Friends and Family:    Attends Religious Services:    Active Member of Clubs or Organizations:    Attends Engineer, structural:    Marital Status:      Family History: The patient's family history includes Anxiety disorder in her brother and mother; Arthritis in her maternal grandmother; Autism in her brother; Basal cell carcinoma in her mother; Breast cancer in her maternal grandmother; Diabetes in her maternal grandmother; Sjogren's syndrome in her mother; Skin cancer in her maternal grandmother.  ROS:   Please see the history of present illness.     All other systems reviewed and are negative.  EKGs/Labs/Other Studies Reviewed:    The following studies were reviewed today:   EKG:     October 03, 2019:  Sinus tach at 104.   No ST or T wave abn.    Recent Labs: 01/14/2019: ALT 36; BUN 11; Creat 0.94; Hemoglobin 13.9; Platelets 368; Potassium 4.6; Sodium 142; TSH 1.31  Recent Lipid Panel    Component Value Date/Time   CHOL 186 01/14/2019 0813   TRIG 70 01/14/2019 0813   HDL 44 (L) 01/14/2019 0813   CHOLHDL 4.2 01/14/2019 0813   LDLCALC 126 (H) 01/14/2019 0813    Physical Exam:    Physical Exam: Blood pressure 102/72, pulse (!) 104, height 5\' 5"  (1.651 m), weight 157 lb (71.2 kg), SpO2 98 %.  GEN:  Well nourished, well developed in no acute  distress HEENT: Normal NECK: No JVD; No carotid bruits LYMPHATICS: No lymphadenopathy CARDIAC: RRR  RESPIRATORY:  Clear to auscultation without rales, wheezing or rhonchi  ABDOMEN: Soft, non-tender, non-distended MUSCULOSKELETAL:  No edema; No deformity  SKIN: Warm and dry NEUROLOGIC:  Alert and oriented x 3   ASSESSMENT:    1. Sinus tachycardia    PLAN:       Sinus tachycardia:    We were able to achieve a well-controlled heart rate on Cora Lenore and metoprolol.  She is now planning on becoming pregnant.  We discontinued both the metoprolol and Corlanor and she has been started on labetalol 50 mg twice a day.  She seems to be tolerating it fairly well.  Her heart rate is 104 which is probably acceptable.  Her blood pressure is on the low side and she does have some occasional episodes of orthostasis.  I I do not think that we can go up at this point on the labetalol at this point   I will plan on seeing her back in 6 months.  We will continue to reassess.  If her blood pressure increases slightly during pregnancy we will use that opportunity to increase the labetalol to a higher dose in order to achieve a lower heart rate.  I have reminded her to make sure that she stays hydrated and to eat plenty of protein and salt.   Medication Adjustments/Labs and Tests Ordered: Current medicines are reviewed at length with the patient today.  Concerns regarding medicines are outlined above.  Orders Placed This Encounter  Procedures   EKG 12-Lead   No orders of the defined types were placed in this encounter.   Patient Instructions  Medication Instructions:  Your physician recommends that you continue on your current medications as directed. Please refer to the Current Medication list given to you today.  *If you need a refill on your cardiac medications before your next appointment, please call your pharmacy*   Lab Work: None ordered If you have labs (blood work) drawn today and  your tests are completely normal, you will receive your results only by:  MyChart Message (if you have MyChart) OR  A paper copy in the mail If you have any lab test that is abnormal or we need to change your treatment, we will call you to review the results.   Testing/Procedures: None ordered   Follow-Up: At [facility], you and your health needs are our priority.  As part of our continuing mission to provide you with exceptional heart care, we have created designated Provider Care Teams.  These Care Teams include your primary Cardiologist (physician) and Advanced Practice Providers (APPs -  Physician Assistants and Nurse Practitioners) who all work together to provide you with the care you need, when you need it.    Your next appointment:   6 month(s)  The format for your next appointment:   In Person  Provider:   You may see Kristeen MissPhilip Lorissa Kishbaugh, MD or one of the following Advanced Practice Providers  on your designated Care Team:    Tereso Newcomer, PA-C  Chelsea Aus, [place]         Signed, Kristeen Miss, MD  10/03/2019 12:02 PM    [facility]

## 2019-10-03 ENCOUNTER — Ambulatory Visit (INDEPENDENT_AMBULATORY_CARE_PROVIDER_SITE_OTHER): Payer: BC Managed Care – PPO | Admitting: Cardiovascular Disease

## 2019-10-03 ENCOUNTER — Other Ambulatory Visit: Payer: Self-pay

## 2019-10-03 ENCOUNTER — Encounter: Payer: Self-pay | Admitting: Cardiovascular Disease

## 2019-10-03 VITALS — BP 102/72 | HR 104 | Ht 65.0 in | Wt 157.0 lb

## 2019-10-03 DIAGNOSIS — R000 Tachycardia, unspecified: Secondary | ICD-10-CM | POA: Diagnosis not present

## 2019-10-03 NOTE — Patient Instructions (Signed)
Medication Instructions:  Your physician recommends that you continue on your current medications as directed. Please refer to the Current Medication list given to you today.  *If you need a refill on your cardiac medications before your next appointment, please call your pharmacy*   Lab Work: None ordered If you have labs (blood work) drawn today and your tests are completely normal, you will receive your results only by: Marland Kitchen MyChart Message (if you have MyChart) OR . A paper copy in the mail If you have any lab test that is abnormal or we need to change your treatment, we will call you to review the results.   Testing/Procedures: None ordered   Follow-Up: At [facility], you and your health needs are our priority.  As part of our continuing mission to provide you with exceptional heart care, we have created designated Provider Care Teams.  These Care Teams include your primary Cardiologist (physician) and Advanced Practice Providers (APPs -  Physician Assistants and Nurse Practitioners) who all work together to provide you with the care you need, when you need it.    Your next appointment:   6 month(s)  The format for your next appointment:   In Person  Provider:   You may see Kristeen Miss, MD or one of the following Advanced Practice Providers on your designated Care Team:    Tereso Newcomer, PA-C  Vin Wel[place], [place]

## 2019-12-01 ENCOUNTER — Other Ambulatory Visit: Payer: Self-pay | Admitting: Neurology

## 2019-12-18 DIAGNOSIS — R0981 Nasal congestion: Secondary | ICD-10-CM | POA: Diagnosis not present

## 2019-12-18 DIAGNOSIS — R519 Headache, unspecified: Secondary | ICD-10-CM | POA: Diagnosis not present

## 2019-12-18 DIAGNOSIS — Z8616 Personal history of COVID-19: Secondary | ICD-10-CM | POA: Diagnosis not present

## 2020-02-23 ENCOUNTER — Emergency Department (HOSPITAL_COMMUNITY): Payer: BC Managed Care – PPO

## 2020-02-23 ENCOUNTER — Ambulatory Visit: Payer: BC Managed Care – PPO | Admitting: Neurology

## 2020-02-23 ENCOUNTER — Encounter (HOSPITAL_COMMUNITY): Payer: Self-pay | Admitting: Emergency Medicine

## 2020-02-23 ENCOUNTER — Emergency Department (HOSPITAL_COMMUNITY)
Admission: EM | Admit: 2020-02-23 | Discharge: 2020-02-23 | Disposition: A | Discharge status: Home / Self Care | Payer: BC Managed Care – PPO | Attending: Emergency Medicine | Admitting: Emergency Medicine

## 2020-02-23 DIAGNOSIS — Z7982 Long term (current) use of aspirin: Secondary | ICD-10-CM | POA: Insufficient documentation

## 2020-02-23 DIAGNOSIS — R102 Pelvic and perineal pain: Secondary | ICD-10-CM | POA: Insufficient documentation

## 2020-02-23 DIAGNOSIS — R1030 Lower abdominal pain, unspecified: Secondary | ICD-10-CM | POA: Insufficient documentation

## 2020-02-23 DIAGNOSIS — N898 Other specified noninflammatory disorders of vagina: Secondary | ICD-10-CM | POA: Diagnosis not present

## 2020-02-23 DIAGNOSIS — N854 Malposition of uterus: Secondary | ICD-10-CM | POA: Diagnosis not present

## 2020-02-23 LAB — COMPREHENSIVE METABOLIC PANEL
ALT: 25 U/L (ref 0–44)
AST: 20 U/L (ref 15–41)
Albumin: 4 g/dL (ref 3.5–5.0)
Alkaline Phosphatase: 75 U/L (ref 38–126)
Anion gap: 9 (ref 5–15)
BUN: 7 mg/dL (ref 6–20)
CO2: 26 mmol/L (ref 22–32)
Calcium: 9.1 mg/dL (ref 8.9–10.3)
Chloride: 106 mmol/L (ref 98–111)
Creatinine, Ser: 0.86 mg/dL (ref 0.44–1.00)
GFR, Estimated: >60 mL/min (ref >60)
Glucose, Bld: 70 mg/dL (ref 70–99)
Potassium: 4.2 mmol/L (ref 3.5–5.1)
Sodium: 141 mmol/L (ref 135–145)
Total Bilirubin: 0.6 mg/dL (ref 0.3–1.2)
Total Protein: 6.8 g/dL (ref 6.5–8.1)

## 2020-02-23 LAB — CBC
HCT: 41.2 % (ref 36.0–46.0)
Hemoglobin: 13.5 g/dL (ref 12.0–15.0)
MCH: 29.3 pg (ref 26.0–34.0)
MCHC: 32.8 g/dL (ref 30.0–36.0)
MCV: 89.4 fL (ref 80.0–100.0)
Platelets: 319 10*3/uL (ref 150–400)
RBC: 4.61 MIL/uL (ref 3.87–5.11)
RDW: 12.6 % (ref 11.5–15.5)
WBC: 8 10*3/uL (ref 4.0–10.5)
nRBC: 0 % (ref 0.0–0.2)

## 2020-02-23 LAB — WET PREP, GENITAL
Clue Cells Wet Prep HPF POC: NONE SEEN
Sperm: NONE SEEN
Trich, Wet Prep: NONE SEEN
WBC, Wet Prep HPF POC: NONE SEEN
Yeast Wet Prep HPF POC: NONE SEEN

## 2020-02-23 LAB — I-STAT BETA HCG BLOOD, ED (MC, WL, AP ONLY): I-stat hCG, quantitative: <5 m[IU]/mL (ref <5)

## 2020-02-23 LAB — HIV ANTIBODY (ROUTINE TESTING W REFLEX): HIV Screen 4th Generation wRfx: NONREACTIVE

## 2020-02-23 LAB — URINALYSIS, ROUTINE W REFLEX MICROSCOPIC
Bilirubin Urine: NEGATIVE
Glucose, UA: NEGATIVE mg/dL
Hgb urine dipstick: NEGATIVE
Ketones, ur: NEGATIVE mg/dL
Leukocytes,Ua: NEGATIVE
Nitrite: NEGATIVE
Protein, ur: NEGATIVE mg/dL
Specific Gravity, Urine: 1.004 — ABNORMAL LOW (ref 1.005–1.030)
pH: 8 (ref 5.0–8.0)

## 2020-02-23 LAB — LIPASE, BLOOD: Lipase: 36 U/L (ref 11–51)

## 2020-02-23 NOTE — ED Notes (Signed)
Thermometer does not work and was unable to locate portable

## 2020-02-23 NOTE — ED Notes (Signed)
Pelvic cart at bedside. 

## 2020-02-23 NOTE — ED Triage Notes (Signed)
Pt reports hx of ovarian cysts, reports pain a couple of days ago, that improved, then she reports clear fluid leaking from her vagina yesterday and today. Reports that pain has worsened again today. Mild vaginal spotting yesterday. Spoke with her OBGYN who referred her here. LMP 12/1.

## 2020-02-23 NOTE — ED Provider Notes (Signed)
[facility] EMERGENCY DEPARTMENT Provider Note   CSN: [id number] Arrival date & time: 02/23/20  1119     History Chief Complaint  Patient presents with  . Abdominal Pain    Virginia Kane is a 24 y.o. female.  The history is provided by the patient. No language interpreter was used.  Abdominal Pain    24 year old female significant history of ovarian cyst presenting complaining of vaginal discharge patient report for the past 2 days she has had pain to her lower pelvic region more so on the left side.  She described pain as a sharp sensation, waxing waning worse with movement.  Furthermore she also noticed some clear vaginal discharge and today sees a mild vaginal spotting.  She reports discharge is clear without any odor.  Pain is currently rated 6 out of 10.  She does not complain of any fever chills no chest pain shortness of breath no back pain.  She has never been pregnant.  She is actively trying to conceive with her husband.  She denies having this pain after sexual intercourse.  She did reach out to her OB/GYN today who recommend patient come to ER for further evaluation.  Past Medical History:  Diagnosis Date  . Anxiety   . Depression   . Epistaxis   . Febrile seizure (HCC)    24 y.o.   . IBS (irritable bowel syndrome)   . Migraines   . Ovarian cyst   . Scoliosis   . Tachycardia     Patient Active Problem List   Diagnosis Date Noted  . Sinus tachycardia 01/07/2019  . Migraine with aura and without status migrainosus, not intractable 01/07/2019  . Generalized anxiety disorder 01/07/2019  . Recurrent major depressive disorder (HCC) 01/07/2019  . Irritable bowel syndrome with both constipation and diarrhea 01/07/2019  . Cyst of ovary 01/07/2019  . Hx of epistaxis 01/07/2019  . Scoliosis 01/07/2019    Past Surgical History:  Procedure Laterality Date  . WISDOM TOOTH EXTRACTION     2017 [facility]      OB History   No obstetric  history on file.     Family History  Problem Relation Age of Onset  . Sjogren's syndrome Mother   . Anxiety disorder Mother   . Basal cell carcinoma Mother   . Autism Brother   . Anxiety disorder Brother   . Diabetes Maternal Grandmother   . Arthritis Maternal Grandmother   . Breast cancer Maternal Grandmother   . Skin cancer Maternal Grandmother     Social History   Tobacco Use  . Smoking status: Never Smoker  . Smokeless tobacco: Never Used  Vaping Use  . Vaping Use: Never used  Substance Use Topics  . Alcohol use: Yes    Alcohol/week: 1.0 - 2.0 standard drink    Types: 1 - 2 Standard drinks or equivalent per week  . Drug use: Never    Home Medications Prior to Admission medications   Medication Sig Start Date End Date Taking? Authorizing Provider  amitriptyline (ELAVIL) 100 MG tablet TAKE 1 TABLET BY MOUTH EVERYDAY AT BEDTIME 12/01/19   Everlena Cooper, Adam R, DO  Aspirin-Acetaminophen-Caffeine (EXCEDRIN MIGRAINE PO) Patient takes 1 tab by mouth 4 times a month as needed    [provider]  labetalol (NORMODYNE) 100 MG tablet Take 0.5 tablets (50 mg total) by mouth 2 (two) times daily. 09/22/19   Nahser, Deloris Ping, MD  Prenatal MV & Min w/FA-DHA (PRENATAL ADULT GUMMY/DHA/FA PO)  Take 2 tablets by mouth daily.     [provider]  Ubrogepant (UBRELVY) 100 MG TABS Take 1 tablet by mouth as needed (May repeat dose after 2 hours if needed.  Maximum 2 tablets in 24 hours). 02/02/19   Drema Dallas, DO    Allergies    Patient has no known allergies.  Review of Systems   Review of Systems  Gastrointestinal: Positive for abdominal pain.  All other systems reviewed and are negative.   Physical Exam Updated Vital Signs BP 110/71 (BP Location: Right Arm)   Pulse 95   Temp 98.7 F (37.1 C) (Oral)   Resp 16   Ht 5\' 3"  (1.6 m)   Wt 70.3 kg   LMP 02/15/2020   SpO2 99%   BMI 27.46 kg/m   Physical Exam Vitals and nursing note reviewed.  Constitutional:       General: She is not in acute distress.    Appearance: She is well-developed and well-nourished.  HENT:     Head: Atraumatic.  Eyes:     Conjunctiva/sclera: Conjunctivae normal.  Cardiovascular:     Rate and Rhythm: Normal rate and regular rhythm.     Heart sounds: Normal heart sounds.  Pulmonary:     Effort: Pulmonary effort is normal.     Breath sounds: Normal breath sounds.  Abdominal:     General: Abdomen is flat. Bowel sounds are normal.     Palpations: Abdomen is soft.     Tenderness: There is abdominal tenderness (Tenderness to left lower pelvic region on palpation no guarding rebound tenderness). There is no right CVA tenderness or left CVA tenderness.  Musculoskeletal:     Cervical back: Neck supple.  Skin:    Findings: No rash.  Neurological:     Mental Status: She is alert.  Psychiatric:        Mood and Affect: Mood and affect normal.     ED Results / Procedures / Treatments   Labs (all labs ordered are listed, but only abnormal results are displayed) Labs Reviewed  URINALYSIS, ROUTINE W REFLEX MICROSCOPIC - Abnormal; Notable for the following components:      Result Value   Color, Urine STRAW (*)    Specific Gravity, Urine 1.004 (*)    All other components within normal limits  WET PREP, GENITAL  LIPASE, BLOOD  COMPREHENSIVE METABOLIC PANEL  CBC  RPR  HIV ANTIBODY (ROUTINE TESTING W REFLEX)  I-STAT BETA HCG BLOOD, ED ([facility], [facility], [facility] ONLY)  GC/CHLAMYDIA PROBE AMP ([place]) NOT AT [facility]    EKG None  Radiology [facility] PELVIC COMPLETE W TRANSVAGINAL AND TORSION R/O  Result Date: 02/23/2020 CLINICAL DATA:  Left pelvic pain. Last menstrual period 02/15/2020. Patient premenopausal. Patient not on tamoxifen or hormone replacement therapy. EXAM: TRANSABDOMINAL AND TRANSVAGINAL ULTRASOUND OF PELVIS DOPPLER ULTRASOUND OF OVARIES TECHNIQUE: Both transabdominal and transvaginal ultrasound examinations of the pelvis were performed. Transabdominal technique was performed for  global imaging of the pelvis including uterus, ovaries, adnexal regions, and pelvic cul-de-sac. It was necessary to proceed with endovaginal exam following the transabdominal exam to visualize the bilateral ovaries. Color and duplex Doppler ultrasound was utilized to evaluate blood flow to the ovaries. COMPARISON:  None. FINDINGS: Uterus Measurements: 7.1 x 2.1 x 3cm = volume: 23.7 mL. The uterus is retroflexed and retroverted no fibroids or other mass visualized. Endometrium Thickness: 5 mm.  No focal abnormality visualized. Right ovary Measurements: 3.2 x 1.4 x 2 cm = volume: 4.9 mL. Normal  appearance/no adnexal mass. Left ovary Measurements: 3 x 2.5 x 2.3 cm = volume: 9 mL. Normal appearance/no adnexal mass. Pulsed Doppler evaluation of both ovaries demonstrates normal low-resistance arterial and venous waveforms. Other findings No abnormal free fluid. IMPRESSION: Unremarkable pelvic ultrasound. Electronically Signed   By: Tish Frederickson M.D.   On: 02/23/2020 17:49    Procedures Pelvic exam  Date/Time: 02/23/2020 4:23 PM Performed by: Fayrene Helper, PA-C Authorized by: Fayrene Helper, PA-C  Consent: Verbal consent obtained. Risks and benefits: risks, benefits and alternatives were discussed Consent given by: patient Patient understanding: patient states understanding of the procedure being performed Patient consent: the patient's understanding of the procedure matches consent given Procedure consent: procedure consent matches procedure scheduled Patient identity confirmed: verbally with patient Local anesthesia used: no  Anesthesia: Local anesthesia used: no  Sedation: Patient sedated: no  Patient tolerance: patient tolerated the procedure well with no immediate complications Comments: Lauren, NT available to chaperone.  No inguinal lymphadenopathy or inguinal hernia noted.  Normal external genitalia free of lesion or rash.  And no significant discomfort with speculum insertion.  Normal close  cervical os without any abnormal skin changes.  No significant vaginal discharge noted.  On bimanual examination left adnexal tenderness without cervical motion tenderness.    (including critical care time)  Medications Ordered in ED Medications - No data to display  ED Course  I have reviewed the triage vital signs and the nursing notes.  Pertinent labs & imaging results that were available during my care of the patient were reviewed by me and considered in my medical decision making (see chart for details).    MDM Rules/Calculators/A&P                          BP (!) 109/93 (BP Location: Right Arm)   Pulse 83   Temp 98.7 F (37.1 C) (Oral)   Resp 18   Ht 5\' 3"  (1.6 m)   Wt 70.3 kg   LMP 02/15/2020   SpO2 100%   BMI 27.46 kg/m   Final Clinical Impression(s) / ED Diagnoses Final diagnoses:  Pelvic pain in female    Rx / DC Orders ED Discharge Orders    None     4:07 PM Patient with history of ovarian cyst here with left pelvic pain for the past 2 days.  Plan to perform pelvic semination to rule out ovarian torsion or TOA but I suspect this is likely a ruptured ovarian cyst.  6:08 PM Urine without signs of Neri tract infection, wet prep unremarkable, pregnancy test is negative, labs are reassuring.  Pelvic and transvaginal ultrasound showed an unremarkable exam without any evidence of ovarian torsion's and no abnormal free fluid  Patient last menstruation is 02/15/2020.  I suspect her pain may be due to mittelschmerz as a potential cause.  I have low suspicion for appendicitis or other acute abdominal pathology.  Patient is stable for discharge.  Return precaution discussed   14/03/2019 02/23/20 1812    14/09/21, MD 02/23/20 1949

## 2020-02-23 NOTE — ED Notes (Signed)
Pt stated that pain has increased since the ultra sound, 7 out of 10. Pain is intermittent.

## 2020-02-24 LAB — RPR: RPR Ser Ql: NONREACTIVE

## 2020-02-24 LAB — GC/CHLAMYDIA PROBE AMP (~~LOC~~) NOT AT ARMC
Chlamydia: NEGATIVE
Comment: NEGATIVE
Comment: NORMAL
Neisseria Gonorrhea: NEGATIVE

## 2020-02-26 ENCOUNTER — Other Ambulatory Visit: Payer: Self-pay | Admitting: Neurology

## 2020-03-17 NOTE — L&D Delivery Note (Signed)
Delivery Note Virginia Kane is a G1P0 at [redacted]w[redacted]d who had a spontaneous delivery at 2018 a viable female ("[name]") was delivered via  LOA.  APGAR: 9, 9; weight 5lb15.2oz (2700g) .     Admitted for induction for oligohydramnios and IUGR. Induced with pitocin and AROM. Progressed normally. Received epidural for pain management. Pushed for 30 minutes. Baby was delivered without difficulty. Cord loosely around right arm  Delayed cord clamping for 60 seconds.  Delivery of placenta was spontaneous. Placenta was found to be intact, 3 -vessel cord was noted. The fundus was found to be firm. Perineum intact. Estimated blood loss 100cc.Instrument and gauze counts were correct at the end of the procedure.   Placenta status: to pathology  Anesthesia:  epidural Episiotomy:  none Lacerations:  none Suture Repair: n/a Est. Blood Loss (mL):   Mom to postpartum.  Baby to Couplet care / Skin to Skin.  Charlett Nose 03/12/2021, 8:44 PM

## 2020-05-30 NOTE — Progress Notes (Signed)
Cardiology Office Note:    Date:  06/01/2020   ID:  Virginia Kane, DOB [birth year], MRN [id number]  PCP:  Caesar Bookman, NP  Cardiologist:  Shanee Batch  Electrophysiologist:  None   Referring MD: Caesar Bookman, NP   Chief Complaint  Patient presents with  . Tachycardia     Previous notes:    Virginia Kane is a 25 y.o. female with a hx of sinus tach Has had since age 49  Has had echo in the past  Does not do cardio because she is scared.  Sleeps well  Does not exercise as much  Takes Elavil for migraines Tachycardia does not vary with her menstral cycle.   easts and drinks regularly .   previous work up is not available    October 03, 2019  Exie is seen today for follow up of her sinus tachycardia She has been well controlled on metoprolol and corlanor for the past year but now she and her husband are planning on becoming pregnant. Corlanor has been stopped and we have started Labetolol 50 mg BID  She feels well  HR was very well controlled on the corlanol and metoprolol combo.  Still fairly well controlled.     June 01, 2020 Virginia Kane is seen today for follow up of her sinus tach. Is on a plant based diet  Has been been exercise   Past Medical History:  Diagnosis Date  . Anxiety   . Depression   . Epistaxis   . Febrile seizure (HCC)    25 y.o.   . IBS (irritable bowel syndrome)   . Migraines   . Ovarian cyst   . Scoliosis   . Tachycardia     Past Surgical History:  Procedure Laterality Date  . WISDOM TOOTH EXTRACTION     2017 [facility]     Current Medications: Current Meds  Medication Sig  . amitriptyline (ELAVIL) 100 MG tablet TAKE 1 TABLET BY MOUTH EVERYDAY AT BEDTIME  . Aspirin-Acetaminophen-Caffeine (EXCEDRIN MIGRAINE PO) Patient takes 1 tab by mouth 4 times a month as needed  . labetalol (NORMODYNE) 100 MG tablet Take 0.5 tablets (50 mg total) by mouth 2 (two) times daily.  . Prenatal MV & Min w/FA-DHA (PRENATAL ADULT  GUMMY/DHA/FA PO) Take 2 tablets by mouth daily.   Marland Kitchen Ubrogepant (UBRELVY) 100 MG TABS Take 1 tablet by mouth as needed (May repeat dose after 2 hours if needed.  Maximum 2 tablets in 24 hours).     Allergies:   Patient has no known allergies.   Social History   Socioeconomic History  . Marital status: Married    Spouse name: Not on file  . Number of children: 0  . Years of education: Not on file  . Highest education level: Master's degree (e.g., MA, MS, MEng, MEd, MSW, MBA)  Occupational History  . Not on file  Tobacco Use  . Smoking status: Never Smoker  . Smokeless tobacco: Never Used  Vaping Use  . Vaping Use: Never used  Substance and Sexual Activity  . Alcohol use: Yes    Alcohol/week: 1.0 - 2.0 standard drink    Types: 1 - 2 Standard drinks or equivalent per week  . Drug use: Never  . Sexual activity: Not on file  Other Topics Concern  . Not on file  Social History Narrative   Social History      Diet? regular      Do you drink/eat things with caffeine? yes  Marital status?           married                         What year were you married? 2020      Do you live in a house, apartment, assisted living, condo, trailer, etc.?  house      Is it one or more stories? one      How many persons live in your home? 2       Do you have any pets in your home? (please list) 2 dog 2 lizards      Highest level of education completed? 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       no                               Type & how often? Need to more often       Advanced Directives      Do you have a living will? no      Do you have a DNR form?               no                   If not, do you want to discuss one?no       Do you have signed POA/HPOA for forms? No       Functional Status      Do you have difficulty bathing or dressing yourself? no      Do you have difficulty preparing food or eating? no      Do you  have difficulty managing your medications? No       Do you have difficulty managing your finances? No       Do you have difficulty affording your medications? No       Social Determinants of Corporate investment banker Strain: Not on file  Food Insecurity: Not on file  Transportation Needs: Not on file  Physical Activity: Not on file  Stress: Not on file  Social Connections: Not on file     Family History: The patient's family history includes Anxiety disorder in her brother and mother; Arthritis in her maternal grandmother; Autism in her brother; Basal cell carcinoma in her mother; Breast cancer in her maternal grandmother; Diabetes in her maternal grandmother; Sjogren's syndrome in her mother; Skin cancer in her maternal grandmother.  ROS:   Please see the history of present illness.     All other systems reviewed and are negative.  EKGs/Labs/Other Studies Reviewed:    The following studies were reviewed today:   EKG:    June 01, 2020: Normal sinus rhythm at 92.  No ST or T wave changes.  Recent Labs: 02/23/2020: ALT 25; BUN 7; Creatinine, Ser 0.86; Hemoglobin 13.5; Platelets 319; Potassium 4.2; Sodium 141  Recent Lipid Panel    Component Value Date/Time   CHOL 186 01/14/2019 0813   TRIG 70 01/14/2019 0813   HDL 44 (L) 01/14/2019 0813   CHOLHDL 4.2 01/14/2019 0813   LDLCALC 126 (H) 01/14/2019 0813    Physical Exam:    Physical Exam: Blood pressure 102/68, pulse 92, height 5\' 5"  (1.651 m), weight 147 lb (66.7 kg), SpO2 99 %.  GEN:  Well nourished,  well developed in no acute distress HEENT: Normal NECK: No JVD; No carotid bruits LYMPHATICS: No lymphadenopathy CARDIAC: RRR , no murmurs, rubs, gallops RESPIRATORY:  Clear to auscultation without rales, wheezing or rhonchi  ABDOMEN: Soft, non-tender, non-distended MUSCULOSKELETAL:  No edema; No deformity  SKIN: Warm and dry NEUROLOGIC:  Alert and oriented x 3    ASSESSMENT:    1. Sinus tachycardia    PLAN:        Sinus tachycardia:    She is tolerating the labetalol well.  Cont  Advised more exercise  Will see her in 1 year.      Medication Adjustments/Labs and Tests Ordered: Current medicines are reviewed at length with the patient today.  Concerns regarding medicines are outlined above.  Orders Placed This Encounter  Procedures  . EKG 12-Lead   No orders of the defined types were placed in this encounter.   Patient Instructions  Medication Instructions:  Your physician recommends that you continue on your current medications as directed. Please refer to the Current Medication list given to you today.  *If you need a refill on your cardiac medications before your next appointment, please call your pharmacy*   Lab Work: None today      Testing/Procedures: none   Follow-Up: At [facility], you and your health needs are our priority.  As part of our continuing mission to provide you with exceptional heart care, we have created designated Provider Care Teams.  These Care Teams include your primary Cardiologist (physician) and Advanced Practice Providers (APPs -  Physician Assistants and Nurse Practitioners) who all work together to provide you with the care you need, when you need it.   Your next appointment:   1 year(s)  The format for your next appointment:   In Person  Provider:   You may see Kristeen Miss, MD or one of the following Advanced Practice Providers on your designated Care Team:    Tereso Newcomer, PA-C  Chelsea Aus, [place]      Signed, Kristeen Miss, MD  06/01/2020 9:30 AM    [facility]

## 2020-06-01 ENCOUNTER — Encounter: Payer: Self-pay | Admitting: Cardiovascular Disease

## 2020-06-01 ENCOUNTER — Ambulatory Visit (INDEPENDENT_AMBULATORY_CARE_PROVIDER_SITE_OTHER): Payer: BC Managed Care – PPO | Admitting: Cardiovascular Disease

## 2020-06-01 ENCOUNTER — Other Ambulatory Visit: Payer: Self-pay

## 2020-06-01 VITALS — BP 102/68 | HR 92 | Ht 65.0 in | Wt 147.0 lb

## 2020-06-01 DIAGNOSIS — R000 Tachycardia, unspecified: Secondary | ICD-10-CM | POA: Diagnosis not present

## 2020-06-01 NOTE — Patient Instructions (Signed)
Medication Instructions:  Your physician recommends that you continue on your current medications as directed. Please refer to the Current Medication list given to you today.  *If you need a refill on your cardiac medications before your next appointment, please call your pharmacy*   Lab Work: None today     Testing/Procedures: none   Follow-Up: At [facility], you and your health needs are our priority.  As part of our continuing mission to provide you with exceptional heart care, we have created designated Provider Care Teams.  These Care Teams include your primary Cardiologist (physician) and Advanced Practice Providers (APPs -  Physician Assistants and Nurse Practitioners) who all work together to provide you with the care you need, when you need it.   Your next appointment:   1 year(s)  The format for your next appointment:   In Person  Provider:   You may see Philip Nahser, MD or one of the following Advanced Practice Providers on your designated Care Team:    Scott Weaver, PA-C  Vin Bhagat, PA-C     

## 2020-06-15 DIAGNOSIS — E669 Obesity, unspecified: Secondary | ICD-10-CM | POA: Diagnosis not present

## 2020-06-15 DIAGNOSIS — Z130 Encounter for screening for diseases of the blood and blood-forming organs and certain disorders involving the immune mechanism: Secondary | ICD-10-CM | POA: Diagnosis not present

## 2020-06-15 DIAGNOSIS — Z124 Encounter for screening for malignant neoplasm of cervix: Secondary | ICD-10-CM | POA: Diagnosis not present

## 2020-06-15 DIAGNOSIS — Z01419 Encounter for gynecological examination (general) (routine) without abnormal findings: Secondary | ICD-10-CM | POA: Diagnosis not present

## 2020-06-21 ENCOUNTER — Other Ambulatory Visit: Payer: Self-pay | Admitting: Obstetrics and Gynecology

## 2020-06-22 ENCOUNTER — Other Ambulatory Visit: Payer: Self-pay | Admitting: Obstetrics and Gynecology

## 2020-06-24 NOTE — Progress Notes (Deleted)
NEUROLOGY FOLLOW UP OFFICE NOTE  [name] [id number]  Assessment/Plan:   1.  Migraine without aura, without status migrainosus, not intractable 2.  Hemiplegic migraine, without status migrainosus, not intractable  1.  Migraine prevention:  *** 2.  Migraine rescue:  *** 3.  Limit use of pain relievers to no more than 2 days out of week to prevent risk of rebound or medication-overuse headache. 4.  Keep headache diary 5.  ***  Subjective:  Virginia Kane is a 25 year old female with tachycardia, depression and anxiety who follows up for hemiplegic migraine.  UPDATE: Last seen in June.  At that time, she informed me that she was going to start trying to get pregnant.  *** Current NSAIDS:none Current analgesics:none Current triptans:none Current ergotamine:none Current anti-emetic:none Current muscle relaxants:none Current anti-anxiolytic:none Current sleep aide:none Current Antihypertensive medications:labetelol(for sinus tachycardia) Current Antidepressant medications:Amitriptyline 100mg  at bedtime Current Anticonvulsant medications:None Current anti-CGRP:Ubrelvy 100mg  Current Vitamins/Herbal/Supplements:MVI Current Antihistamines/Decongestants:none Other therapy:none Hormone/birth control:Larissia  Caffeine:Coffee, energy drinks Diet:Drinks 2 big water bottles daily Exercise:Not routine Depression:stable; Anxiety:stable Other pain:no Sleep hygiene:good  HISTORY: Onset:Migraines since childhood. Relatively well-controlled Location:Holocephalic but primarily bitemporal Quality:Pressure and throbbing Initial intensity:If delays treatment, then 8/10; if treats early, then 6/10;denies new headache, thunderclap headache or severe headache that wakes from sleep. Aura:none Premonitory Phase:none Postdrome:Usually no Associated symptoms:Nausea, vomiting,blurred vision, photophobia, phonophobia,  osmophobia. Rarely, vomiting or pain is so severe that she has passed out. She has had 3 episodes of left hemiplegic migraine (arm and leg) associated with left sided numbness (including face) with some slurred speech, that resolves by the next day. Initial duration:At least 8 hour Initial frequency:4to 5times a month Initial frequency of abortive medication:4 to 5 days a month Triggers:Emotional stress, alcohol (certain beer and wine) Relieving factors:Applying pressure to temples, sometimes sleep, early treatment with Excedrin. Activity:aggravates   Past NSAIDS:Advil, naproxen Past analgesics:Tylenol; Excedrin Migraine Past abortive triptans:Sumatriptan tablet Past abortive ergotamine:none Past muscle relaxants:none Past anti-emetic:none Past antihypertensive medications:metoprolol Past antidepressant medications:none Past anticonvulsant medications:none Past anti-CGRP:none Past vitamins/Herbal/Supplements:none Past antihistamines/decongestants:none Other past therapies:none   Family history of headache:Mother (migraines) Other history: Sinus tachycardia since age 68. Has been evaluated by cardiology. On metoprolol and Corlandor. Echocardiogram scheduled.  PAST MEDICAL HISTORY: Past Medical History:  Diagnosis Date  . Anxiety   . Depression   . Epistaxis   . Febrile seizure (HCC)    25 y.o.   . IBS (irritable bowel syndrome)   . Migraines   . Ovarian cyst   . Scoliosis   . Tachycardia     MEDICATIONS: Current Outpatient Medications on File Prior to Visit  Medication Sig Dispense Refill  . amitriptyline (ELAVIL) 100 MG tablet TAKE 1 TABLET BY MOUTH EVERYDAY AT BEDTIME 90 tablet 1  . Aspirin-Acetaminophen-Caffeine (EXCEDRIN MIGRAINE PO) Patient takes 1 tab by mouth 4 times a month as needed    . labetalol (NORMODYNE) 100 MG tablet Take 0.5 tablets (50 mg total) by mouth 2 (two) times daily. 90 tablet 3  . Prenatal MV &  Min w/FA-DHA (PRENATAL ADULT GUMMY/DHA/FA PO) Take 2 tablets by mouth daily.     Ubrogepant (UBRELVY) 100 MG TABS Take 1 tablet by mouth as needed (May repeat dose after 2 hours if needed.  Maximum 2 tablets in 24 hours). 10 tablet 11   No current facility-administered medications on file prior to visit.    ALLERGIES: No Known Allergies  FAMILY HISTORY: Family History  Problem Relation Age of Onset  . Sjogren's syndrome Mother   .  Anxiety disorder Mother   . Basal cell carcinoma Mother   . Autism Brother   . Anxiety disorder Brother   . Diabetes Maternal Grandmother   . Arthritis Maternal Grandmother   . Breast cancer Maternal Grandmother   . Skin cancer Maternal Grandmother       Objective:  *** General: No acute distress.  Patient appears well-groomed.   Head:  Normocephalic/atraumatic Eyes:  Fundi examined but not visualized Neck: supple, no paraspinal tenderness, full range of motion Heart:  Regular rate and rhythm Lungs:  Clear to auscultation bilaterally Back: No paraspinal tenderness Neurological Exam: alert and oriented to person, place, and time. Attention span and concentration intact, recent and remote memory intact, fund of knowledge intact.  Speech fluent and not dysarthric, language intact.  CN II-XII intact. Bulk and tone normal, muscle strength 5/5 throughout.  Sensation to light touch, temperature and vibration intact.  Deep tendon reflexes 2+ throughout, toes downgoing.  Finger to nose and heel to shin testing intact.  Gait normal, Romberg negative.     Shon Millet, DO  CC: Richarda Blade, NP

## 2020-06-25 ENCOUNTER — Ambulatory Visit: Payer: BC Managed Care – PPO | Admitting: Neurology

## 2020-06-27 ENCOUNTER — Other Ambulatory Visit: Payer: Self-pay | Admitting: Obstetrics and Gynecology

## 2020-06-27 DIAGNOSIS — N6019 Diffuse cystic mastopathy of unspecified breast: Secondary | ICD-10-CM

## 2020-07-27 ENCOUNTER — Other Ambulatory Visit: Payer: Self-pay

## 2020-07-27 ENCOUNTER — Other Ambulatory Visit: Payer: Self-pay | Admitting: Obstetrics and Gynecology

## 2020-07-27 ENCOUNTER — Ambulatory Visit
Admission: RE | Admit: 2020-07-27 | Discharge: 2020-07-27 | Disposition: A | Discharge status: Home / Self Care | Payer: BC Managed Care – PPO | Source: Ambulatory Visit | Attending: Obstetrics and Gynecology | Admitting: Obstetrics and Gynecology

## 2020-07-27 DIAGNOSIS — N6489 Other specified disorders of breast: Secondary | ICD-10-CM | POA: Diagnosis not present

## 2020-07-27 DIAGNOSIS — N6019 Diffuse cystic mastopathy of unspecified breast: Secondary | ICD-10-CM

## 2020-07-30 DIAGNOSIS — Z3201 Encounter for pregnancy test, result positive: Secondary | ICD-10-CM | POA: Diagnosis not present

## 2020-08-01 NOTE — Progress Notes (Signed)
NEUROLOGY FOLLOW UP OFFICE NOTE  Virginia Kane [id number]  Assessment/Plan:   1.  Migraine without aura, without status migrainosus, not intractable 2.  Hemiplegic migraine, without status migrainosus, not intractable  1.  Tylenol as needed.  Limit use of pain relievers to no more than 2 days out of week to prevent risk of rebound or medication-overuse headache.  May also use essential oil/biofreeze to temples for relief. 2.  If headache frequency increases, consider magnesium oxide 400mg  and riboflavin 400mg  daily 3.  Follow up 6 months.  Subjective:  Virginia Kane is a 25 year old female with sinus tachycardia, depression and anxiety who follows up for hemiplegic migraine.  UPDATE: At last visit, she said she was going to start trying to get pregnant.  She found out 3 weeks ago that she is pregnant (now 6 weeks).  She has stopped all medications.  Sleeping is poor couple of manageable headaches with tylenol   1 a month on 100mg   Current NSAIDS:none Current analgesics:none Current triptans:none Current ergotamine:none Current anti-emetic:none Current muscle relaxants:none Current anti-anxiolytic:none Current sleep aide:none Current Antihypertensive medications:Metoprolol tartrate 12.5mg  twice daily(for sinus tachycardia) Current Antidepressant medications:none Current Anticonvulsant medications:None Current anti-CGRP:none Current Vitamins/Herbal/Supplements:MVI Current Antihistamines/Decongestants:none Other therapy:none Hormone/birth control:Larissia   Diet:Drinks 2 big water bottles daily.  Now mostly vegan diet.     HISTORY: Onset:Migraines since childhood. Relatively well-controlled Location:Holocephalic but primarily bitemporal Quality:Pressure and throbbing Initial intensity:If delays treatment, then 8/10; if treats early, then 6/10;denies new headache, thunderclap headache or severe headache that wakes from  sleep. Aura:none Premonitory Phase:none Postdrome:Usually no Associated symptoms:Nausea, vomiting,blurred vision, photophobia, phonophobia, osmophobia. Rarely, vomiting or pain is so severe that she has passed out. She has had 3 episodes of left hemiplegic migraine (arm and leg) associated with left sided numbness (including face) with some slurred speech, that resolves by the next day. Initial duration:At least 8 hour Initial frequency:4to 5times a month Initial frequency of abortive medication:4 to 5 days a month Triggers:Emotional stress, alcohol (certain beer and wine) Relieving factors:Applying pressure to temples, sometimes sleep, early treatment with Excedrin. Activity:aggravates   Past NSAIDS:Advil, naproxen Past analgesics:Tylenol; Excedrin Migraine Past abortive triptans:Sumatriptan tablet Past abortive ergotamine:none Past muscle relaxants:none Past anti-emetic:none Past antihypertensive medications:none Past antidepressant medications:amitriptyline 100mg  QHS (effective) Past anticonvulsant medications:none Past anti-CGRP:Ubrelvy 100mg  (effective) Past vitamins/Herbal/Supplements:none Past antihistamines/decongestants:none Other past therapies:none   Family history of headache:Mother (migraines) Other history: Sinus tachycardia since age 78. Has been evaluated by cardiology. On metoprolol and Corlandor. Echocardiogram scheduled.  PAST MEDICAL HISTORY: Past Medical History:  Diagnosis Date  . Anxiety   . Depression   . Epistaxis   . Febrile seizure (HCC)    25 y.o.   . IBS (irritable bowel syndrome)   . Migraines   . Ovarian cyst   . Scoliosis   . Tachycardia     MEDICATIONS: Current Outpatient Medications on File Prior to Visit  Medication Sig Dispense Refill  . amitriptyline (ELAVIL) 100 MG tablet TAKE 1 TABLET BY MOUTH EVERYDAY AT BEDTIME 90 tablet 1  . Aspirin-Acetaminophen-Caffeine (EXCEDRIN  MIGRAINE PO) Patient takes 1 tab by mouth 4 times a month as needed    . labetalol (NORMODYNE) 100 MG tablet Take 0.5 tablets (50 mg total) by mouth 2 (two) times daily. 90 tablet 3  . Prenatal MV & Min w/FA-DHA (PRENATAL ADULT GUMMY/DHA/FA PO) Take 2 tablets by mouth daily.     30 Ubrogepant (UBRELVY) 100 MG TABS Take 1 tablet by mouth as needed (May repeat dose after 2  hours if needed.  Maximum 2 tablets in 24 hours). 10 tablet 11   No current facility-administered medications on file prior to visit.    ALLERGIES: No Known Allergies  FAMILY HISTORY: Family History  Problem Relation Age of Onset  . Sjogren's syndrome Mother   . Anxiety disorder Mother   . Basal cell carcinoma Mother   . Autism Brother   . Anxiety disorder Brother   . Diabetes Maternal Grandmother   . Arthritis Maternal Grandmother   . Breast cancer Maternal Grandmother   . Skin cancer Maternal Grandmother       Objective:  Blood pressure 117/76, pulse (!) 106, height 5\' 5"  (1.651 m), weight 140 lb 12.8 oz (63.9 kg), last menstrual period 02/15/2020, SpO2 99 %. General: No acute distress.  Patient appears well-groomed.   Head:  Normocephalic/atraumatic Eyes:  Fundi examined but not visualized Neck: supple, no paraspinal tenderness, full range of motion Heart:  Regular rate and rhythm Lungs:  Clear to auscultation bilaterally Back: No paraspinal tenderness Neurological Exam: alert and oriented to person, place, and time. Speech fluent and not dysarthric, language intact.  CN II-XII intact. Bulk and tone normal, muscle strength 5/5 throughout.  Sensation to light touch  intact.  Deep tendon reflexes 2+ throughout.  Finger to nose testing intact.  Gait normal, Romberg negative.  14/03/2019, DO  CC: Shon Millet, NP

## 2020-08-02 DIAGNOSIS — Z3A01 Less than 8 weeks gestation of pregnancy: Secondary | ICD-10-CM | POA: Diagnosis not present

## 2020-08-02 DIAGNOSIS — O3680X0 Pregnancy with inconclusive fetal viability, not applicable or unspecified: Secondary | ICD-10-CM | POA: Diagnosis not present

## 2020-08-03 ENCOUNTER — Encounter: Payer: Self-pay | Admitting: Neurology

## 2020-08-03 ENCOUNTER — Other Ambulatory Visit: Payer: Self-pay

## 2020-08-03 ENCOUNTER — Ambulatory Visit (INDEPENDENT_AMBULATORY_CARE_PROVIDER_SITE_OTHER): Payer: BC Managed Care – PPO | Admitting: Neurology

## 2020-08-03 VITALS — BP 117/76 | HR 106 | Ht 65.0 in | Wt 140.8 lb

## 2020-08-03 DIAGNOSIS — Z3A01 Less than 8 weeks gestation of pregnancy: Secondary | ICD-10-CM | POA: Diagnosis not present

## 2020-08-03 DIAGNOSIS — G43409 Hemiplegic migraine, not intractable, without status migrainosus: Secondary | ICD-10-CM

## 2020-08-03 DIAGNOSIS — G43009 Migraine without aura, not intractable, without status migrainosus: Secondary | ICD-10-CM

## 2020-08-03 NOTE — Patient Instructions (Signed)
Congratulations! Use Tylenol as needed but limit to no more than 2 days out of week.  May use mint/essential oil or Biofreeze to the temples for relief.  If you think you are having increased headaches, consider magnesium oxide 400mg  and riboflavin 400mg  daily.  Follow up 6 months.

## 2020-08-14 DIAGNOSIS — Z3A01 Less than 8 weeks gestation of pregnancy: Secondary | ICD-10-CM | POA: Diagnosis not present

## 2020-08-14 DIAGNOSIS — O3680X0 Pregnancy with inconclusive fetal viability, not applicable or unspecified: Secondary | ICD-10-CM | POA: Diagnosis not present

## 2020-08-17 ENCOUNTER — Ambulatory Visit: Payer: BC Managed Care – PPO | Admitting: Neurology

## 2020-08-21 DIAGNOSIS — N911 Secondary amenorrhea: Secondary | ICD-10-CM | POA: Diagnosis not present

## 2020-08-21 DIAGNOSIS — Z3201 Encounter for pregnancy test, result positive: Secondary | ICD-10-CM | POA: Diagnosis not present

## 2020-08-30 ENCOUNTER — Other Ambulatory Visit: Payer: Self-pay

## 2020-08-30 ENCOUNTER — Ambulatory Visit: Payer: BC Managed Care – PPO | Attending: Obstetrics and Gynecology

## 2020-09-08 ENCOUNTER — Other Ambulatory Visit: Payer: Self-pay | Admitting: Neurology

## 2020-09-20 DIAGNOSIS — Z3689 Encounter for other specified antenatal screening: Secondary | ICD-10-CM | POA: Diagnosis not present

## 2020-09-20 DIAGNOSIS — Z113 Encounter for screening for infections with a predominantly sexual mode of transmission: Secondary | ICD-10-CM | POA: Diagnosis not present

## 2020-09-20 DIAGNOSIS — Z363 Encounter for antenatal screening for malformations: Secondary | ICD-10-CM | POA: Diagnosis not present

## 2020-09-20 LAB — OB RESULTS CONSOLE ABO/RH: RH Type: POSITIVE

## 2020-09-20 LAB — OB RESULTS CONSOLE HIV ANTIBODY (ROUTINE TESTING): HIV: NONREACTIVE

## 2020-09-20 LAB — OB RESULTS CONSOLE GC/CHLAMYDIA
Chlamydia: NEGATIVE
Gonorrhea: NEGATIVE

## 2020-09-20 LAB — HEPATITIS C ANTIBODY: HCV Ab: NEGATIVE

## 2020-09-20 LAB — OB RESULTS CONSOLE HEPATITIS B SURFACE ANTIGEN: Hepatitis B Surface Ag: NEGATIVE

## 2020-09-20 LAB — OB RESULTS CONSOLE RPR: RPR: NONREACTIVE

## 2020-09-20 LAB — OB RESULTS CONSOLE RUBELLA ANTIBODY, IGM: Rubella: IMMUNE

## 2020-10-19 ENCOUNTER — Other Ambulatory Visit: Payer: Self-pay | Admitting: Cardiovascular Disease

## 2020-12-10 ENCOUNTER — Ambulatory Visit: Payer: BC Managed Care – PPO | Admitting: Neurology

## 2021-02-12 NOTE — Progress Notes (Signed)
NEUROLOGY FOLLOW UP OFFICE NOTE  Virginia Kane [id number]  Assessment/Plan:   1.  Migraine without aura, without status migrainosus, not intractable 2.  Hemiplegic migraine, without status migrainosus, not intractable 3.  Pregnant at [redacted] weeks gestation   1.  At this time, she will continue magnesium oxide 400mg  daily. 2.  She may continue using Tylenol as needed.  Limit use of pain relievers to no more than 2 days out of week to prevent risk of rebound or medication-overuse headache. 3.  If she starts having increased headache frequency after having her baby and while breastfeeding, I would consider retrying sumatriptan for rescue, and propranolol if migraines are frequent. 4  Follow up 7-8 months.  Subjective:  Virginia Kane is a 25 year old female with sinus tachycardia, depression and anxiety who follows up for hemiplegic migraine.   UPDATE: Currently at [redacted] weeks gestation. She has only had a handful of migraines over her pregnancy.  She started taking the magnesium oxide and they resolved.  She is planning to breast feed.   Current NSAIDS:  none Current analgesics:  Tylenol Current triptans:  none Current ergotamine:  none Current anti-emetic:  none Current muscle relaxants:  none Current anti-anxiolytic:  none Current sleep aide:  none Current Antihypertensive medications:  labetolol Current Antidepressant medications:  none Current Anticonvulsant medications:  None Current anti-CGRP:  none Current Vitamins/Herbal/Supplements:  magnesium oxide, B6 Current Antihistamines/Decongestants:  none Other therapy:  none Hormone/birth control:  34     Diet:  Drinks 2 big water bottles daily.  Now mostly vegan diet.       HISTORY:  Onset:  Migraines since childhood.  Relatively well-controlled Location:  Holocephalic but primarily bitemporal Quality:  Pressure and throbbing Initial intensity:  If delays treatment, then 8/10; if treats early, then 6/10; denies new  headache, thunderclap headache or severe headache that wakes  from sleep. Aura:  none Premonitory Phase:  none Postdrome:  Usually no Associated symptoms:  Nausea, vomiting, blurred vision, photophobia, phonophobia, osmophobia.  Rarely, vomiting or pain is so severe that she has passed out. She has had 3 episodes of left hemiplegic migraine (arm and leg) associated with left sided numbness (including face) with some slurred speech, that resolves by the next day.  Initial duration:  At least 8 hour Initial frequency:  4 to 5 times a month Initial frequency of abortive medication: 4 to 5 days a month Triggers:  Emotional stress, alcohol (certain beer and wine) Relieving factors:  Applying pressure to temples, sometimes sleep, early treatment with Excedrin. Activity:  aggravates     Past NSAIDS:  Advil, naproxen Past analgesics:  Tylenol; Excedrin Migraine Past abortive triptans:  Sumatriptan tablet Past abortive ergotamine:  none Past muscle relaxants:  none Past anti-emetic:  none Past antihypertensive medications:  metoprolol Past antidepressant medications:  amitriptyline 100mg  QHS (effective) Past anticonvulsant medications:  none Past anti-CGRP:  Ubrelvy 100mg  (effective) Past vitamins/Herbal/Supplements:  none Past antihistamines/decongestants:  none Other past therapies:  none     Family history of headache:  Mother (migraines) Other history:  Sinus tachycardia since age 61.  Has been evaluated by cardiology.  On metoprolol and Cor[place]or.  Echocardiogram scheduled.  PAST MEDICAL HISTORY: Past Medical History:  Diagnosis Date   Anxiety    Depression    Epistaxis    Febrile seizure (HCC)    25 y.o.    IBS (irritable bowel syndrome)    Migraines    Ovarian cyst    Scoliosis  Tachycardia     MEDICATIONS: Current Outpatient Medications on File Prior to Visit  Medication Sig Dispense Refill   amitriptyline (ELAVIL) 100 MG tablet TAKE 1 TABLET BY MOUTH EVERYDAY AT  BEDTIME 90 tablet 0   Aspirin-Acetaminophen-Caffeine (EXCEDRIN MIGRAINE PO) Patient takes 1 tab by mouth 4 times a month as needed (Patient not taking: Reported on 08/03/2020)     labetalol (NORMODYNE) 100 MG tablet TAKE 0.5 TABLETS (50 MG TOTAL) BY MOUTH 2 (TWO) TIMES DAILY. 90 tablet 3   Prenatal MV & Min w/FA-DHA (PRENATAL ADULT GUMMY/DHA/FA PO) Take 2 tablets by mouth daily.      Ubrogepant (UBRELVY) 100 MG TABS Take 1 tablet by mouth as needed (May repeat dose after 2 hours if needed.  Maximum 2 tablets in 24 hours). (Patient not taking: Reported on 08/03/2020) 10 tablet 11   No current facility-administered medications on file prior to visit.    ALLERGIES: No Known Allergies  FAMILY HISTORY: Family History  Problem Relation Age of Onset   Sjogren's syndrome Mother    Anxiety disorder Mother    Basal cell carcinoma Mother    Autism Brother    Anxiety disorder Brother    Diabetes Maternal Grandmother    Arthritis Maternal Grandmother    Breast cancer Maternal Grandmother    Skin cancer Maternal Grandmother       Objective:  Blood pressure 106/61, pulse 97, height 5\' 5"  (1.651 m), weight 144 lb 12.8 oz (65.7 kg), last menstrual period 02/15/2020, SpO2 97 %.  General: No acute distress.  Patient appears well-groomed.   Head:  Normocephalic/atraumatic Eyes:  Fundi examined but not visualized Neck: supple, no paraspinal tenderness, full range of motion Heart:  Regular rate and rhythm Lungs:  Clear to auscultation bilaterally Back: No paraspinal tenderness Neurological Exam: alert and oriented to person, place, and time.  Speech fluent and not dysarthric, language intact.  CN II-XII intact. Bulk and tone normal, muscle strength 5/5 throughout.  Sensation to light touch intact.  Deep tendon reflexes 2+ throughout, toes downgoing.  Finger to nose testing intact.  Gait normal, Romberg negative.   14/03/2019, DO  CC: Shon Millet, NP

## 2021-02-13 ENCOUNTER — Other Ambulatory Visit: Payer: Self-pay

## 2021-02-13 ENCOUNTER — Encounter: Payer: Self-pay | Admitting: Neurology

## 2021-02-13 ENCOUNTER — Ambulatory Visit: Payer: 59 | Admitting: Neurology

## 2021-02-13 VITALS — BP 106/61 | HR 97 | Ht 65.0 in | Wt 144.8 lb

## 2021-02-13 DIAGNOSIS — G43009 Migraine without aura, not intractable, without status migrainosus: Secondary | ICD-10-CM

## 2021-02-13 DIAGNOSIS — Z3A34 34 weeks gestation of pregnancy: Secondary | ICD-10-CM

## 2021-02-13 DIAGNOSIS — G43409 Hemiplegic migraine, not intractable, without status migrainosus: Secondary | ICD-10-CM

## 2021-02-13 NOTE — Patient Instructions (Signed)
Congratulations! If headaches start getting worse, let me know and we can start something Continue magnesium oxide daily, Tylenol as needed (but limited to no more than 2 days out of week) Follow up 7 to 8 months.

## 2021-03-05 LAB — OB RESULTS CONSOLE GBS: GBS: NEGATIVE

## 2021-03-12 ENCOUNTER — Encounter (HOSPITAL_COMMUNITY): Payer: Self-pay

## 2021-03-12 ENCOUNTER — Inpatient Hospital Stay (HOSPITAL_COMMUNITY): Payer: 59 | Admitting: Anesthesiology

## 2021-03-12 ENCOUNTER — Inpatient Hospital Stay (HOSPITAL_COMMUNITY)
Admission: AD | Admit: 2021-03-12 | Discharge: 2021-03-14 | DRG: 807 | Disposition: A | Discharge status: Home / Self Care | Payer: 59 | Attending: Obstetrics and Gynecology | Admitting: Obstetrics and Gynecology

## 2021-03-12 DIAGNOSIS — O4103X0 Oligohydramnios, third trimester, not applicable or unspecified: Principal | ICD-10-CM | POA: Diagnosis present

## 2021-03-12 DIAGNOSIS — R000 Tachycardia, unspecified: Secondary | ICD-10-CM | POA: Diagnosis present

## 2021-03-12 DIAGNOSIS — Z3A37 37 weeks gestation of pregnancy: Secondary | ICD-10-CM

## 2021-03-12 DIAGNOSIS — O99892 Other specified diseases and conditions complicating childbirth: Secondary | ICD-10-CM | POA: Diagnosis present

## 2021-03-12 DIAGNOSIS — O4100X0 Oligohydramnios, unspecified trimester, not applicable or unspecified: Secondary | ICD-10-CM | POA: Diagnosis present

## 2021-03-12 DIAGNOSIS — O365930 Maternal care for other known or suspected poor fetal growth, third trimester, not applicable or unspecified: Secondary | ICD-10-CM | POA: Diagnosis present

## 2021-03-12 DIAGNOSIS — Z20822 Contact with and (suspected) exposure to covid-19: Secondary | ICD-10-CM | POA: Diagnosis present

## 2021-03-12 LAB — TYPE AND SCREEN
ABO/RH(D): A POS
Antibody Screen: NEGATIVE

## 2021-03-12 LAB — CBC
HCT: 35.7 % — ABNORMAL LOW (ref 36.0–46.0)
Hemoglobin: 11.6 g/dL — ABNORMAL LOW (ref 12.0–15.0)
MCH: 27 pg (ref 26.0–34.0)
MCHC: 32.5 g/dL (ref 30.0–36.0)
MCV: 83.2 fL (ref 80.0–100.0)
Platelets: 336 10*3/uL (ref 150–400)
RBC: 4.29 MIL/uL (ref 3.87–5.11)
RDW: 13.2 % (ref 11.5–15.5)
WBC: 8.8 10*3/uL (ref 4.0–10.5)
nRBC: 0 % (ref 0.0–0.2)

## 2021-03-12 LAB — RESP PANEL BY RT-PCR (FLU A&B, COVID) ARPGX2
Influenza A by PCR: NEGATIVE
Influenza B by PCR: NEGATIVE
SARS Coronavirus 2 by RT PCR: NEGATIVE

## 2021-03-12 MED ORDER — SIMETHICONE 80 MG PO CHEW: 80.0000 mg | CHEWABLE_TABLET | ORAL | Status: DC | PRN

## 2021-03-12 MED ORDER — EPHEDRINE 5 MG/ML INJ: 10.0000 mg | INTRAVENOUS | Status: DC | PRN

## 2021-03-12 MED ORDER — LACTATED RINGERS IV SOLN: 500.0000 mL | Freq: Once | INTRAVENOUS | Status: DC

## 2021-03-12 MED ORDER — FENTANYL CITRATE (PF) 100 MCG/2ML IJ SOLN: 50.0000 ug | INTRAMUSCULAR | Status: DC | PRN

## 2021-03-12 MED ORDER — FENTANYL-BUPIVACAINE-NACL 0.5-0.125-0.9 MG/250ML-% EP SOLN
12.0000 mL/h | EPIDURAL | Status: DC | PRN
  Administered 2021-03-12: 19:00:00 12 mL/h via EPIDURAL

## 2021-03-12 MED ORDER — DIBUCAINE (PERIANAL) 1 % EX OINT: 1.0000 "application " | TOPICAL_OINTMENT | CUTANEOUS | Status: DC | PRN

## 2021-03-12 MED ORDER — DIPHENHYDRAMINE HCL 50 MG/ML IJ SOLN: 12.5000 mg | INTRAMUSCULAR | Status: DC | PRN

## 2021-03-12 MED ORDER — PHENYLEPHRINE 40 MCG/ML (10ML) SYRINGE FOR IV PUSH (FOR BLOOD PRESSURE SUPPORT)
PREFILLED_SYRINGE | INTRAVENOUS | Status: AC
  Filled 2021-03-12: qty 10

## 2021-03-12 MED ORDER — OXYTOCIN BOLUS FROM INFUSION
333.0000 mL | Freq: Once | INTRAVENOUS | Status: AC
  Administered 2021-03-12: 20:00:00 333 mL via INTRAVENOUS

## 2021-03-12 MED ORDER — PHENYLEPHRINE 40 MCG/ML (10ML) SYRINGE FOR IV PUSH (FOR BLOOD PRESSURE SUPPORT): 80.0000 ug | PREFILLED_SYRINGE | INTRAVENOUS | Status: DC | PRN

## 2021-03-12 MED ORDER — OXYCODONE-ACETAMINOPHEN 5-325 MG PO TABS: 2.0000 | ORAL_TABLET | ORAL | Status: DC | PRN

## 2021-03-12 MED ORDER — TERBUTALINE SULFATE 1 MG/ML IJ SOLN: 0.2500 mg | Freq: Once | INTRAMUSCULAR | Status: DC | PRN

## 2021-03-12 MED ORDER — MISOPROSTOL 25 MCG QUARTER TABLET: 25.0000 ug | ORAL_TABLET | ORAL | Status: DC | PRN

## 2021-03-12 MED ORDER — ACETAMINOPHEN 325 MG PO TABS: 650.0000 mg | ORAL_TABLET | ORAL | Status: DC | PRN

## 2021-03-12 MED ORDER — WITCH HAZEL-GLYCERIN EX PADS: 1.0000 "application " | MEDICATED_PAD | CUTANEOUS | Status: DC | PRN

## 2021-03-12 MED ORDER — ONDANSETRON HCL 4 MG/2ML IJ SOLN: 4.0000 mg | Freq: Four times a day (QID) | INTRAMUSCULAR | Status: DC | PRN

## 2021-03-12 MED ORDER — DIPHENHYDRAMINE HCL 25 MG PO CAPS: 25.0000 mg | ORAL_CAPSULE | Freq: Four times a day (QID) | ORAL | Status: DC | PRN

## 2021-03-12 MED ORDER — LIDOCAINE HCL (PF) 1 % IJ SOLN
INTRAMUSCULAR | Status: DC | PRN
  Administered 2021-03-12: 2 mL via EPIDURAL
  Administered 2021-03-12: 3 mL via EPIDURAL
  Administered 2021-03-12: 5 mL via EPIDURAL

## 2021-03-12 MED ORDER — OXYTOCIN-SODIUM CHLORIDE 30-0.9 UT/500ML-% IV SOLN
1.0000 m[IU]/min | INTRAVENOUS | Status: DC
  Administered 2021-03-12: 14:00:00 2 m[IU]/min via INTRAVENOUS

## 2021-03-12 MED ORDER — OXYCODONE HCL 5 MG PO TABS: 10.0000 mg | ORAL_TABLET | ORAL | Status: DC | PRN

## 2021-03-12 MED ORDER — LIDOCAINE HCL (PF) 1 % IJ SOLN: 30.0000 mL | INTRAMUSCULAR | Status: DC | PRN

## 2021-03-12 MED ORDER — PRENATAL MULTIVITAMIN CH
1.0000 | ORAL_TABLET | Freq: Every day | ORAL | Status: DC
  Administered 2021-03-13: 11:00:00 1 via ORAL
  Filled 2021-03-12: qty 1

## 2021-03-12 MED ORDER — TETANUS-DIPHTH-ACELL PERTUSSIS 5-2.5-18.5 LF-MCG/0.5 IM SUSY: 0.5000 mL | PREFILLED_SYRINGE | Freq: Once | INTRAMUSCULAR | Status: DC

## 2021-03-12 MED ORDER — LACTATED RINGERS IV SOLN: 500.0000 mL | INTRAVENOUS | Status: DC | PRN

## 2021-03-12 MED ORDER — BISACODYL 10 MG RE SUPP: 10.0000 mg | Freq: Every day | RECTAL | Status: DC | PRN

## 2021-03-12 MED ORDER — OXYCODONE-ACETAMINOPHEN 5-325 MG PO TABS: 1.0000 | ORAL_TABLET | ORAL | Status: DC | PRN

## 2021-03-12 MED ORDER — ONDANSETRON HCL 4 MG/2ML IJ SOLN: 4.0000 mg | INTRAMUSCULAR | Status: DC | PRN

## 2021-03-12 MED ORDER — SOD CITRATE-CITRIC ACID 500-334 MG/5ML PO SOLN: 30.0000 mL | ORAL | Status: DC | PRN

## 2021-03-12 MED ORDER — FLEET ENEMA 7-19 GM/118ML RE ENEM: 1.0000 | ENEMA | Freq: Every day | RECTAL | Status: DC | PRN

## 2021-03-12 MED ORDER — FENTANYL-BUPIVACAINE-NACL 0.5-0.125-0.9 MG/250ML-% EP SOLN
EPIDURAL | Status: AC
  Filled 2021-03-12: qty 250

## 2021-03-12 MED ORDER — LACTATED RINGERS IV SOLN: INTRAVENOUS | Status: DC

## 2021-03-12 MED ORDER — BENZOCAINE-MENTHOL 20-0.5 % EX AERO: 1.0000 "application " | INHALATION_SPRAY | CUTANEOUS | Status: DC | PRN

## 2021-03-12 MED ORDER — IBUPROFEN 600 MG PO TABS
600.0000 mg | ORAL_TABLET | Freq: Four times a day (QID) | ORAL | Status: DC
  Administered 2021-03-12 – 2021-03-14 (×6): 600 mg via ORAL
  Filled 2021-03-12 (×6): qty 1

## 2021-03-12 MED ORDER — DOCUSATE SODIUM 100 MG PO CAPS
100.0000 mg | ORAL_CAPSULE | Freq: Two times a day (BID) | ORAL | Status: DC
  Administered 2021-03-13 (×2): 100 mg via ORAL
  Filled 2021-03-12 (×2): qty 1

## 2021-03-12 MED ORDER — COCONUT OIL OIL: 1.0000 "application " | TOPICAL_OIL | Status: DC | PRN

## 2021-03-12 MED ORDER — ONDANSETRON HCL 4 MG PO TABS: 4.0000 mg | ORAL_TABLET | ORAL | Status: DC | PRN

## 2021-03-12 MED ORDER — OXYTOCIN-SODIUM CHLORIDE 30-0.9 UT/500ML-% IV SOLN
2.5000 [IU]/h | INTRAVENOUS | Status: DC
  Administered 2021-03-12: 20:00:00 2.5 [IU]/h via INTRAVENOUS
  Filled 2021-03-12: qty 500

## 2021-03-12 MED ORDER — OXYCODONE HCL 5 MG PO TABS: 5.0000 mg | ORAL_TABLET | ORAL | Status: DC | PRN

## 2021-03-12 NOTE — Progress Notes (Signed)
OB Progress Note  S: Starting to feel stronger contractions   O: Today's Vitals   03/12/21 1407 03/12/21 1431 03/12/21 1501 03/12/21 1552  BP: 110/65 115/69 111/66 104/66  Pulse: 72 83 68 74  Resp: 16 17 16 15   Temp:      TempSrc:       There is no height or weight on file to calculate BMI.  SVE 4/90/-1, AROM clear blood tinged fluid  FHR: 130bpm, mod variability, + accels, no decels Toco: ctx q 3-4 mins   A/P: 25Y G1P0 @ [redacted]w[redacted]d, with new diagnosis oligohydramnios and IUGR (AC 5.2%), admitted for IOL Fetal wellbeing: cat I tracing, continuous monitoring IOL: s/p AROM, titrate pitocin per protocol, pitocin currently at 8mu/min Pain control: epidural upon patient request  M. 11m, MD 03/12/21 4:12 PM

## 2021-03-12 NOTE — Anesthesia Preprocedure Evaluation (Signed)
Anesthesia Evaluation  Patient identified by MRN, date of birth, ID band Patient awake    Reviewed: Allergy & Precautions, NPO status , Patient's Chart, lab work & pertinent test results, reviewed documented beta blocker date and time   Airway Mallampati: II  TM Distance: >3 FB Neck ROM: Full    Dental  (+) Teeth Intact, Dental Advisory Given   Pulmonary neg pulmonary ROS,    Pulmonary exam normal breath sounds clear to auscultation       Cardiovascular Normal cardiovascular exam+ dysrhythmias Supra Ventricular Tachycardia  Rhythm:Regular Rate:Normal     Neuro/Psych  Headaches, PSYCHIATRIC DISORDERS Anxiety Depression    GI/Hepatic negative GI ROS, Neg liver ROS,   Endo/Other  negative endocrine ROS  Renal/GU negative Renal ROS     Musculoskeletal negative musculoskeletal ROS (+) Scoliosis    Abdominal   Peds  Hematology  (+) Blood dyscrasia, anemia , Plt 336k   Anesthesia Other Findings Day of surgery medications reviewed with the patient.  Reproductive/Obstetrics (+) Pregnancy                             Anesthesia Physical Anesthesia Plan  ASA: 2  Anesthesia Plan: Epidural   Post-op Pain Management:    Induction:   PONV Risk Score and Plan: 2 and Treatment may vary due to age or medical condition  Airway Management Planned: Natural Airway  Additional Equipment:   Intra-op Plan:   Post-operative Plan:   Informed Consent: I have reviewed the patients History and Physical, chart, labs and discussed the procedure including the risks, benefits and alternatives for the proposed anesthesia with the patient or authorized representative who has indicated his/her understanding and acceptance.     Dental advisory given  Plan Discussed with:   Anesthesia Plan Comments: (Patient identified. Risks/Benefits/Options discussed with patient including but not limited to bleeding,  infection, nerve damage, paralysis, failed block, incomplete pain control, headache, blood pressure changes, nausea, vomiting, reactions to medication both or allergic, itching and postpartum back pain. Confirmed with bedside nurse the patient's most recent platelet count. Confirmed with patient that they are not currently taking any anticoagulation, have any bleeding history or any family history of bleeding disorders. Patient expressed understanding and wished to proceed. All questions were answered. )        Anesthesia Quick Evaluation

## 2021-03-12 NOTE — Anesthesia Procedure Notes (Signed)
Epidural Patient location during procedure: OB Start time: 03/12/2021 6:29 PM End time: 03/12/2021 6:36 PM  Staffing Anesthesiologist: Cecile Hearing, MD Performed: anesthesiologist   Preanesthetic Checklist Completed: patient identified, IV checked, risks and benefits discussed, monitors and equipment checked, pre-op evaluation and timeout performed  Epidural Patient position: sitting Prep: DuraPrep Patient monitoring: blood pressure and continuous pulse ox Approach: midline Location: L3-L4 Injection technique: LOR air  Needle:  Needle type: Tuohy  Needle gauge: 17 G Needle length: 9 cm Needle insertion depth: 4.5 cm Catheter size: 19 Gauge Catheter at skin depth: 9.5 cm Test dose: negative and Other (1% Lidocaine)  Additional Notes Patient identified.  Risk benefits discussed including failed block, incomplete pain control, headache, nerve damage, paralysis, blood pressure changes, nausea, vomiting, reactions to medication both toxic or allergic, and postpartum back pain.  Patient expressed understanding and wished to proceed.  All questions were answered.  Sterile technique used throughout procedure and epidural site dressed with sterile barrier dressing. No paresthesia or other complications noted. The patient did not experience any signs of intravascular injection such as tinnitus or metallic taste in mouth nor signs of intrathecal spread such as rapid motor block. Please see nursing notes for vital signs. Reason for block:procedure for pain

## 2021-03-12 NOTE — H&P (Signed)
Virginia Kane is a 25 y.o. female G1P0 [redacted]w[redacted]d presenting for direct admission from office. She had ultrasound done today for size<dates, noted to have oligohydramnios with AFI 3, no 2x2 pocket, and new IUGR with EFW 11% (5lb13oz = 2636g) and AC 5.2%. BPP 6/8 (-2 fluid).  She reports mild contractions started last night and have gotten slightly more frequent this morning. Thinks she lost her mucus plug, otherwise no LOF. No VB. Normal FM.    Pregnancy c/b: History of anxiety: managing without medications currently, doing well History of tachycardia: on labetalol 50mg  BID History of migraines: was on amitriptyline and ubrelvy PRN prior to pregnancy  OB History     Gravida  1   Para      Term      Preterm      AB      Living         SAB      IAB      Ectopic      Multiple      Live Births             Past Medical History:  Diagnosis Date   Anxiety    Depression    Epistaxis    Febrile seizure (HCC)    25 y.o.    IBS (irritable bowel syndrome)    Migraines    Ovarian cyst    Scoliosis    Tachycardia    Past Surgical History:  Procedure Laterality Date   WISDOM TOOTH EXTRACTION     2017 [facility]    Family History: family history includes Anxiety disorder in her brother and mother; Arthritis in her maternal grandmother; Autism in her brother; Basal cell carcinoma in her mother; Breast cancer in her maternal grandmother; Diabetes in her maternal grandmother; Sjogren's syndrome in her mother; Skin cancer in her maternal grandmother. Social History:  reports that she has never smoked. She has never used smokeless tobacco. She reports current alcohol use of about 1.0 - 2.0 standard drink per week. She reports that she does not use drugs.     Maternal Diabetes: No Genetic Screening: Normal Maternal Ultrasounds/Referrals: IUGR - diagnosed today Fetal Ultrasounds or other Referrals:  None Maternal Substance Abuse:  No Significant Maternal  Medications:  None Significant Maternal Lab Results:  Group B Strep negative Other Comments:  None  Review of Systems Per HPI Exam Physical Exam  Dilation: 3 Effacement (%): 70 Station: -1 Exam by:: [email or web address], RN Blood pressure 128/66, pulse 86, temperature 98.6 F (37 C), temperature source Oral, resp. rate 17, last menstrual period 06/21/2020. Gen:NAD, resting comfortably CVS: normal pulses Lungs: Nonlabored respirations Abd: Gravid abdomen  Fetal testing: 130bpm, mod variability, + accels, no decels Toco: ctx q 2-4 mins  Prenatal labs: ABO, Rh:  --/--/PENDING (12/27 1323) Antibody: PENDING (12/27 1323) Rubella: Immune (07/07 0000) RPR: Nonreactive (07/07 0000)  HBsAg: Negative (07/07 0000)  HIV: Non-reactive (07/07 0000)  GBS: Negative/-- (12/20 0000)   Assessment/Plan: 25Y G1P0 @ [redacted]w[redacted]d, with new diagnosis oligohydramnios and IUGR (AC 5.2%), admitted for IOL Fetal wellbeing: cat I tracing, continuous monitoring IOL: pitocin per protocol, will plan AROM when contractions more regular Pain control: epidural upon patient request  [redacted]w[redacted]d 03/12/2021, 2:34 PM

## 2021-03-13 ENCOUNTER — Other Ambulatory Visit: Payer: Self-pay

## 2021-03-13 ENCOUNTER — Encounter (HOSPITAL_COMMUNITY): Payer: Self-pay | Admitting: Obstetrics and Gynecology

## 2021-03-13 LAB — CBC
HCT: 31.3 % — ABNORMAL LOW (ref 36.0–46.0)
Hemoglobin: 10.2 g/dL — ABNORMAL LOW (ref 12.0–15.0)
MCH: 26.9 pg (ref 26.0–34.0)
MCHC: 32.6 g/dL (ref 30.0–36.0)
MCV: 82.6 fL (ref 80.0–100.0)
Platelets: 294 10*3/uL (ref 150–400)
RBC: 3.79 MIL/uL — ABNORMAL LOW (ref 3.87–5.11)
RDW: 13.2 % (ref 11.5–15.5)
WBC: 15.1 10*3/uL — ABNORMAL HIGH (ref 4.0–10.5)
nRBC: 0 % (ref 0.0–0.2)

## 2021-03-13 LAB — SYPHILIS: RPR W/REFLEX TO RPR TITER AND TREPONEMAL ANTIBODIES, TRADITIONAL SCREENING AND DIAGNOSIS ALGORITHM: RPR Ser Ql: NONREACTIVE

## 2021-03-13 NOTE — Lactation Note (Signed)
This note was copied from a baby's chart. Lactation Consultation Note  Patient Name: Virginia Kane [id number] Date: 03/13/2021 Reason for consult: Mother's request;Follow-up assessment;Early term 37-38.6wks;Primapara Age:27 hours  Parents had tried infant at breast, but without success, as "[name]" is so sleepy. Pre-pumping the R breast was attempted during consult with drops of colostrum awaiting [name], but she was still not interested in feeding.   Due to the length of time that had elapsed since the last good feeding (despite multiple attempts), parents were interested in proceeding with DBM. It was given with a slow-flow, White Nfant nipple. Infant did seem to act as if the nipple were a bit fast, so the purple Nfant nipple was provided.   I recommended that the purple Nfant nipple be used for the remainder of the day & the White Nfant nipple can be attempted again tomorrow. If the White Nfant nipple is still too fast tomorrow, then an SLP can pay the family a visit. I spoke with Virginia Kane, SLP about this plan and she was in agreement.   Mom to pump when [facility] is provided.   Virginia Kane [facility] 03/13/2021, 1:56 PM

## 2021-03-13 NOTE — Anesthesia Postprocedure Evaluation (Signed)
Anesthesia Post Note  Patient: Virginia Kane  Procedure(s) Performed: AN AD HOC LABOR EPIDURAL     Patient location during evaluation: Mother Baby Anesthesia Type: Epidural Level of consciousness: awake, oriented and awake and alert Pain management: pain level controlled Vital Signs Assessment: post-procedure vital signs reviewed and stable Respiratory status: spontaneous breathing, respiratory function stable and nonlabored ventilation Cardiovascular status: stable Postop Assessment: no headache, adequate PO intake, patient able to bend at knees, able to ambulate and no apparent nausea or vomiting Anesthetic complications: no   No notable events documented.  Last Vitals:  Vitals:   03/13/21 0330 03/13/21 0756  BP: 97/60 103/64  Pulse: 67 (!) 59  Resp: 16 18  Temp: 36.8 C 36.6 C  SpO2: 97% 100%    Last Pain:  Vitals:   03/13/21 0756  TempSrc: Oral  PainSc: 2    Pain Goal:                Epidural/Spinal Function Cutaneous sensation: Normal sensation (03/13/21 0756), Patient able to flex knees: Yes (03/13/21 0756), Patient able to lift hips off bed: Yes (03/13/21 0756), Back pain beyond tenderness at insertion site: No (03/13/21 0756), Progressively worsening motor and/or sensory loss: No (03/13/21 0756), Bowel and/or bladder incontinence post epidural: No (03/13/21 0756)  Traycen Goyer

## 2021-03-13 NOTE — Social Work (Signed)
CSW received consult for hx of Anxiety and Depression.  CSW met with MOB to offer support and complete assessment.     CSW introduced self and role. CSW observed infant in bassinet. CSW informed MOB of reason for consult. MOB was pleasant as she stated she is doing well. MOB reported she was diagnosed with anxiety and depression in middle school. MOB shared she experienced some generic anxiety during pregnancy, which she managed through self-care. MOB reported she has never been on medication or attended therapy. MOB expressed she has a huge support system, consisting of FOB and her family who lives in [place]. MOB denies any current SI, HI or being involved in DV.    CSW provided education regarding the baby blues period versus PPD and provided resources. CSW provided the New Mom Checklist and encouraged MOB to self evaluate and contact a medical professional if symptoms are noted at any time.   CSW provided review of Sudden Infant Death Syndrome (SIDS) precautions. MOB stated she has everything needed for infant, including a car seat and bassinet. MOB identified [facility] for follow-up care and denies any barriers to care. MOB stated she has no additional needs at this time.   CSW identifies no further need for intervention and no barriers to discharge at this time.  Darra Lis, [place] [employer] [phone]

## 2021-03-13 NOTE — Lactation Note (Signed)
This note was copied from a baby's chart. Lactation Consultation Note  Patient Name: Virginia Kane [id number] Date: 03/13/2021   Age:27 hours  LC visit attempted. Mom was in bathroom & RN was in room. LC to return.   [name] [facility] 03/13/2021, 7:54 AM

## 2021-03-13 NOTE — Lactation Note (Signed)
This note was copied from a baby's chart. Lactation Consultation Note  Patient Name: Virginia Kane SELTR'V Date: 03/13/2021   Age: 27 hrs  Mom is a P1 who has excellent veining on breasts. I anticipate that she will have an abundant milk supply once her milk comes to volume.   Mom reports having noticed some improvement over the last couple of feedings, but also reports some sleepiness. At this time, Mom's nipples are flat. Infant was sleepy and fell asleep at breast.   Hand expression was taught to Mom. B/c of infant's gestation, size, & possibility of needing a nipple shield, a DEBP will be set up with size 21 flanges. Mom agreeable.   If supplementation other than EBM is needed during in-patient stay, parents prefer DBM.   Parents will call out for me when Mom has finished eating breakfast.   [name] [facility] 03/13/2021, 8:54 AM

## 2021-03-13 NOTE — Lactation Note (Signed)
This note was copied from a baby's chart. Lactation Consultation Note  Patient Name: Virginia Kane [id number] Date: 03/13/2021 Reason for consult: Follow-up assessment;Early term 37-38.6wks;Primapara Age:27 hours  Mom was shown how to use DEBP. Parents will call for me when ready for me to return to assess feeding.   Parents have signed consent for DBM.  [name] [facility] 03/13/2021, 10:25 AM

## 2021-03-14 LAB — SURGICAL PATHOLOGY

## 2021-03-14 NOTE — Discharge Summary (Signed)
Postpartum Discharge Summary  Date of Service updated      Patient Name: Virginia Kane DOB: 1995/03/20 MRN: [id number]  Date of admission: 03/12/2021 Delivery date:03/12/2021  Delivering provider: Irene Pap E  Date of discharge: 03/14/2021  Admitting diagnosis: Oligohydramnios [O41.00X0] Intrauterine pregnancy: [redacted]w[redacted]d    Secondary diagnosis:  Principal Problem:   Oligohydramnios  Additional problems: tachycardia, IUGR    Discharge diagnosis: Term Pregnancy Delivered                                              Post partum procedures: none Augmentation: AROM and Pitocin Complications: None  Hospital course: Induction of Labor With Vaginal Delivery   25y.o. yo G1P1001 at 326w5das admitted to the hospital 03/12/2021 for induction of labor.  Indication for induction:  IUGR .  Patient had an uncomplicated labor course as follows: Membrane Rupture Time/Date: 4:02 PM ,03/12/2021   Delivery Method:Vaginal, Spontaneous  Episiotomy: None  Lacerations:  None  Details of delivery can be found in separate delivery note.  Patient had a routine postpartum course. Patient is discharged home 03/14/21.  Newborn Data: Birth date:03/12/2021  Birth time:8:18 PM  Gender:Female  Living status:Living  Apgars:9 ,9  Weight:2700 g   Magnesium Sulfate received: No BMZ received: No Rhophylac:No MMR:No T-DaP:Given prenatally Flu: No Transfusion:No  Physical exam  Vitals:   03/13/21 1102 03/13/21 1453 03/13/21 2008 03/14/21 0524  BP: 104/67 108/68 122/71 108/77  Pulse: 67 97 80 72  Resp: 18 18 16 16   Temp: 98.3 F (36.8 C) 98.1 F (36.7 C) 98 F (36.7 C) 98.5 F (36.9 C)  TempSrc: Oral Oral Oral Oral  SpO2: 98% 100% 100% 100%    Labs: Lab Results  Component Value Date   WBC 15.1 (H) 03/13/2021   HGB 10.2 (L) 03/13/2021   HCT 31.3 (L) 03/13/2021   MCV 82.6 03/13/2021   PLT 294 03/13/2021   CMP Latest Ref Rng & Units 02/23/2020  Glucose 70 - 99 mg/dL 70  BUN 6  - 20 mg/dL 7  Creatinine 0.44 - 1.00 mg/dL 0.86  Sodium 135 - 145 mmol/L 141  Potassium 3.5 - 5.1 mmol/L 4.2  Chloride 98 - 111 mmol/L 106  CO2 22 - 32 mmol/L 26  Calcium 8.9 - 10.3 mg/dL 9.1  Total Protein 6.5 - 8.1 g/dL 6.8  Total Bilirubin 0.3 - 1.2 mg/dL 0.6  Alkaline Phos 38 - 126 U/L 75  AST 15 - 41 U/L 20  ALT 0 - 44 U/L 25   Edinburgh Score: Edinburgh Postnatal Depression Scale Screening Tool 03/13/2021  I have been able to laugh and see the funny side of things. 0  I have looked forward with enjoyment to things. 0  I have blamed myself unnecessarily when things went wrong. 1  I have been anxious or worried for no good reason. 2  I have felt scared or panicky for no good reason. 1  Things have been getting on top of me. 1  I have been so unhappy that I have had difficulty sleeping. 0  I have felt sad or miserable. 0  I have been so unhappy that I have been crying. 1  The thought of harming myself has occurred to me. 0  Edinburgh Postnatal Depression Scale Total 6      After visit meds:  Allergies as of 03/14/2021  No Known Allergies      Medication List     STOP taking these medications    amitriptyline 100 MG tablet Commonly known as: ELAVIL   EXCEDRIN MIGRAINE PO   labetalol 100 MG tablet Commonly known as: NORMODYNE   Ubrelvy 100 MG Tabs Generic drug: Ubrogepant       TAKE these medications    magnesium 30 MG tablet Take 30 mg by mouth 2 (two) times daily.   PRENATAL ADULT GUMMY/DHA/FA PO Take 2 tablets by mouth daily.               Discharge Care Instructions  (From admission, onward)           Start     Ordered   03/14/21 0000  Discharge wound care:       Comments: Sitz baths and icepacks to perineum.  If stitches, they will dissolve.   03/14/21 0655             Discharge home in stable condition Infant Feeding:  ? Infant Disposition:home with mother Discharge instruction: per After Visit Summary and Postpartum  booklet. Activity: Advance as tolerated. Pelvic rest for 6 weeks.  Diet: routine diet Anticipated Birth Control: Unsure Postpartum Appointment:6 weeks Additional Postpartum F/U:  none Future Appointments: Future Appointments  Date Time Provider [place]  09/25/2021 10:30 AM Pieter Partridge, DO [facility] None   Follow up Visit:  [place], [facility] Follow up in 2 week(s).   Why: bp check Contact information: [address] [place] [address] [phone]                     03/14/2021 Daria Pastures, MD

## 2021-03-14 NOTE — Progress Notes (Signed)
Patient is eating, ambulating, voiding.  Pain control is good.  Vitals:   03/13/21 1102 03/13/21 1453 03/13/21 2008 03/14/21 0524  BP: 104/67 108/68 122/71 108/77  Pulse: 67 97 80 72  Resp: 18 18 16 16   Temp: 98.3 F (36.8 C) 98.1 F (36.7 C) 98 F (36.7 C) 98.5 F (36.9 C)  TempSrc: Oral Oral Oral Oral  SpO2: 98% 100% 100% 100%    Fundus firm Perineum without swelling.  Lab Results  Component Value Date   WBC 15.1 (H) 03/13/2021   HGB 10.2 (L) 03/13/2021   HCT 31.3 (L) 03/13/2021   MCV 82.6 03/13/2021   PLT 294 03/13/2021    --/--/A POS (12/27 1323)/RI  A/P Post partum day 2.  Routine care.  Expect d/c today.    11-05-1987

## 2021-03-14 NOTE — Lactation Note (Signed)
This note was copied from a baby's chart. Lactation Consultation Note  Patient Name: Girl Armanda Forand KAJGO'T Date: 03/14/2021 Reason for consult: Follow-up assessment;Early term 37-38.6wks;Infant < 6lbs Age:27 hours  P1, Mother pumping upon entering.  She states the last time she pumped she covered the bottom of bottle so volume is starting to increase. She states baby tires easily at the breast.  Discussed feeding behavior for gestation age.  Encouraged mother to continue offering breast, post pumping and supplementing. Recommend OP appt. Reviewed engorgement care and monitoring voids/stools.   Maternal Data Does the patient have breastfeeding experience prior to this delivery?: No  Feeding Mother's Current Feeding Choice: Breast Milk and Donor Milk Nipple Type: Nfant Slow Flow (purple)   Lactation Tools Discussed/Used Tools: Pump Flange Size: 21 Breast pump type: Double-Electric Breast Pump Reason for Pumping: stimulation and supplementation  Interventions Interventions: Breast feeding basics reviewed;Education;DEBP  Discharge Discharge Education: Engorgement and breast care;Warning signs for feeding baby;Outpatient recommendation Pump: Personal;DEBP (Spectra 1)  Consult Status Consult Status: Complete Date: 03/14/21    Dahlia Byes [facility] 03/14/2021, 9:06 AM

## 2021-03-15 ENCOUNTER — Telehealth: Payer: Self-pay | Admitting: *Deleted

## 2021-03-15 NOTE — Telephone Encounter (Signed)
Transition Care Management Unsuccessful Follow-up Telephone Call  Date of discharge and from where:  03/14/2021 [facility]  Attempts:  1st Attempt  Reason for unsuccessful TCM follow-up call:  Unable to leave message  Patient is following up with OB/GYN due to delivery of baby.

## 2021-03-25 ENCOUNTER — Telehealth (HOSPITAL_COMMUNITY): Payer: Self-pay | Admitting: *Deleted

## 2021-03-25 NOTE — Telephone Encounter (Signed)
Patient voiced no questions or concerns at this time. EPDS=9. Patient stated, "I am feeling better now that we've gotten feeding figured out." RN encouraged patient to contact her doctor if she doesn't continue to feel more like herself over the course of the next week. Patient verbalized understanding. Patient voiced no questions or concerns regarding infant at this time. Patient reports infant sleeps in a bassinet on her back. RN reviewed ABCs of safe sleep. Patient verbalized understanding. Patient requested RN email information on hospital's virtual postpartum classes and support groups. Patient also requested RN email information on maternal mental health resources. Email sent. Erline Levine, RN, 03/25/21, [phone]

## 2021-03-27 ENCOUNTER — Inpatient Hospital Stay (HOSPITAL_COMMUNITY): Payer: 59

## 2021-09-11 ENCOUNTER — Encounter: Payer: Self-pay | Admitting: Cardiovascular Disease

## 2021-09-11 NOTE — Progress Notes (Signed)
Cardiology Office Note:    Date:  09/13/2021   ID:  Virginia Kane, DOB Aug 12, 1995, MRN [id number]  PCP:  Caesar Bookman, NP  Cardiologist:  Joslynne Klatt  Electrophysiologist:  None   Referring MD: Caesar Bookman, NP   Chief Complaint  Patient presents with   Tachycardia     Previous notes:    Virginia Kane is a 26 y.o. female with a hx of sinus tach Has had since age 72  Has had echo in the past  Does not do cardio because she is scared.  Sleeps well  Does not exercise as much  Takes Elavil for migraines Tachycardia does not vary with her menstral cycle.   easts and drinks regularly .   previous work up is not available    October 03, 2019  Virginia Kane is seen today for follow up of her sinus tachycardia She has been well controlled on metoprolol and corlanor for the past year but now she and her husband are planning on becoming pregnant. Corlanor has been stopped and we have started Labetolol 50 mg BID  She feels well  HR was very well controlled on the corlanol and metoprolol combo.  Still fairly well controlled.     June 01, 2020 Virginia Kane is seen today for follow up of her sinus tach. Is on a plant based diet  Has been been exercise   September 13, 2021 Virginia Kane is seen today for follow up of her sinus tachycardia  Wt is 131.  Had a baby in Dec.  Has not been taking her Labetalol all that regularly  Busy with her baby but not much regualar exercise  No cp , no dyspena   Will DC labetalol  Will plan on seeing as needed.     Past Medical History:  Diagnosis Date   Anxiety    Depression    Epistaxis    Febrile seizure (HCC)    26 y.o.    IBS (irritable bowel syndrome)    Migraines    Ovarian cyst    Scoliosis    Tachycardia     Past Surgical History:  Procedure Laterality Date   WISDOM TOOTH EXTRACTION     2017 [facility]     Current Medications: Current Meds  Medication Sig   magnesium 30 MG tablet Take 30 mg by mouth 2 (two) times  daily.   Prenatal MV & Min w/FA-DHA (PRENATAL ADULT GUMMY/DHA/FA PO) Take 2 tablets by mouth daily.    [DISCONTINUED] labetalol (NORMODYNE) 100 MG tablet Take 0.5 tablets by mouth 2 (two) times daily.     Allergies:   Patient has no known allergies.   Social History   Socioeconomic History   Marital status: Married    Spouse name: Not on file   Number of children: 0   Years of education: Not on file   Highest education level: Master's degree (e.g., MA, MS, MEng, MEd, MSW, MBA)  Occupational History   Not on file  Tobacco Use   Smoking status: Never   Smokeless tobacco: Never  Vaping Use   Vaping Use: Never used  Substance and Sexual Activity   Alcohol use: Yes    Alcohol/week: 1.0 - 2.0 standard drink of alcohol    Types: 1 - 2 Standard drinks or equivalent per week   Drug use: Never   Sexual activity: Not on file  Other Topics Concern   Not on file  Social History Narrative   Social History  Diet? regular      Do you drink/eat things with caffeine? yes      Marital status?           married                         What year were you married? 2020      Do you live in a house, apartment, assisted living, condo, trailer, etc.?  house      Is it one or more stories? one      How many persons live in your home? 2       Do you have any pets in your home? (please list) 2 dog 2 lizards      Highest level of education completed? 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       no                               Type & how often? Need to more often       Advanced Directives      Do you have a living will? no      Do you have a DNR form?               no                   If not, do you want to discuss one?no       Do you have signed POA/HPOA for forms? No       Functional Status      Do you have difficulty bathing or dressing yourself? no      Do you have difficulty preparing food or eating? no      Do you have  difficulty managing your medications? No       Do you have difficulty managing your finances? No       Do you have difficulty affording your medications? No       Social Determinants of Radio broadcast assistant Strain: Not on file  Food Insecurity: Not on file  Transportation Needs: Not on file  Physical Activity: Not on file  Stress: Not on file  Social Connections: Not on file     Family History: The patient's family history includes Anxiety disorder in her brother and mother; Arthritis in her maternal grandmother; Autism in her brother; Basal cell carcinoma in her mother; Breast cancer in her maternal grandmother; Diabetes in her maternal grandmother; Sjogren's syndrome in her mother; Skin cancer in her maternal grandmother.  ROS:   Please see the history of present illness.     All other systems reviewed and are negative.  EKGs/Labs/Other Studies Reviewed:    The following studies were reviewed today:   EKG:    September 13, 2021: Normal sinus rhythm at 81.  No ST or T wave changes.  Recent Labs: 03/13/2021: Hemoglobin 10.2; Platelets 294  Recent Lipid Panel    Component Value Date/Time   CHOL 186 01/14/2019 0813   TRIG 70 01/14/2019 0813   HDL 44 (L) 01/14/2019 0813   CHOLHDL 4.2 01/14/2019 0813   LDLCALC 126 (H) 01/14/2019 0813    Physical Exam:    Physical Exam: Blood pressure 103/65, pulse 81, height 5\' 5"  (1.651 m), weight 131 lb 3.2 oz (  59.5 kg), SpO2 98 %, unknown if currently breastfeeding.  GEN:  Well nourished, well developed in no acute distress HEENT: Normal NECK: No JVD; No carotid bruits LYMPHATICS: No lymphadenopathy CARDIAC: RRR , no murmurs, rubs, gallops RESPIRATORY:  Clear to auscultation without rales, wheezing or rhonchi  ABDOMEN: Soft, non-tender, non-distended MUSCULOSKELETAL:  No edema; No deformity  SKIN: Warm and dry NEUROLOGIC:  Alert and oriented x 3    ASSESSMENT:    1. Sinus tachycardia     PLAN:       Sinus  tachycardia:    Her episodes of sinus tachycardia have resolved.  She no longer takes the labetalol on a regular basis but takes it count of as needed or when she remembers.  At this point I do not think that she needs it on a regular basis.  We will discontinue the labetalol.  We will have her see [place] on an as-needed basis.      Medication Adjustments/Labs and Tests Ordered: Current medicines are reviewed at length with the patient today.  Concerns regarding medicines are outlined above.  Orders Placed This Encounter  Procedures   EKG 12-Lead   No orders of the defined types were placed in this encounter.   Patient Instructions  Medication Instructions:  STOP Labetaolol *If you need a refill on your cardiac medications before your next appointment, please call your pharmacy*   Lab Work: NONE If you have labs (blood work) drawn today and your tests are completely normal, you will receive your results only by: MyChart Message (if you have MyChart) OR A paper copy in the mail If you have any lab test that is abnormal or we need to change your treatment, we will call you to review the results.   Testing/Procedures: NONE   Follow-Up: At [facility], you and your health needs are our priority.  As part of our continuing mission to provide you with exceptional heart care, we have created designated Provider Care Teams.  These Care Teams include your primary Cardiologist (physician) and Advanced Practice Providers (APPs -  Physician Assistants and Nurse Practitioners) who all work together to provide you with the care you need, when you need it.  Your next appointment:   As Needed  The format for your next appointment:   In Person  Provider:   Roneshia Drew {  Important Information About Sugar         Signed, Kristeen Miss, MD  09/13/2021 11:36 AM    [facility]

## 2021-09-13 ENCOUNTER — Encounter: Payer: Self-pay | Admitting: Cardiovascular Disease

## 2021-09-13 ENCOUNTER — Ambulatory Visit: Payer: 59 | Admitting: Cardiovascular Disease

## 2021-09-13 VITALS — BP 103/65 | HR 81 | Ht 65.0 in | Wt 131.2 lb

## 2021-09-13 DIAGNOSIS — R000 Tachycardia, unspecified: Secondary | ICD-10-CM

## 2021-09-13 NOTE — Patient Instructions (Signed)
Medication Instructions:  STOP Labetaolol *If you need a refill on your cardiac medications before your next appointment, please call your pharmacy*   Lab Work: NONE If you have labs (blood work) drawn today and your tests are completely normal, you will receive your results only by: MyChart Message (if you have MyChart) OR A paper copy in the mail If you have any lab test that is abnormal or we need to change your treatment, we will call you to review the results.   Testing/Procedures: NONE   Follow-Up: At [facility], you and your health needs are our priority.  As part of our continuing mission to provide you with exceptional heart care, we have created designated Provider Care Teams.  These Care Teams include your primary Cardiologist (physician) and Advanced Practice Providers (APPs -  Physician Assistants and Nurse Practitioners) who all work together to provide you with the care you need, when you need it.  Your next appointment:   As Needed  The format for your next appointment:   In Person  Provider:   Nahser {  Important Information About Sugar

## 2021-09-25 ENCOUNTER — Ambulatory Visit: Payer: 59 | Admitting: Neurology

## 2022-01-20 NOTE — Progress Notes (Unsigned)
NEUROLOGY FOLLOW UP OFFICE NOTE  Virginia Kane [id number]  Assessment/Plan:   1.  Migraine without aura, without status migrainosus, not intractable 2.  Hemiplegic migraine, without status migrainosus, not intractable    1.  Preventative medication not indicated at this time 2   Excedrin as needed. Limit use of pain relievers to no more than 2 days out of week to prevent risk of rebound or medication-overuse headache. 3  Keep headache diary 4  Follow up 6 months.  Subjective:  Virginia Kane is a 26 year old female with sinus tachycardia, depression and anxiety who follows up for hemiplegic migraine.   UPDATE: Doing well.  Only a couple of migraines since the baby was born.  Stopped breastfeeding about a month ago.  No migraine since then.     Current NSAIDS:  none Current analgesics:  Excedrin Current triptans:  none Current ergotamine:  none Current anti-emetic:  none Current muscle relaxants:  none Current anti-anxiolytic:  none Current sleep aide:  none Current Antihypertensive medications:  None Current Antidepressant medications:  none Current Anticonvulsant medications:  None Current anti-CGRP:  none Current Vitamins/Herbal/Supplements:  none Current Antihistamines/Decongestants:  none Other therapy:  none Hormone/birth control:  [place]     Diet:  Drinks 2 big water bottles daily.  Now mostly vegan diet.       HISTORY:  Onset:  Migraines since childhood.  Relatively well-controlled Location:  Holocephalic but primarily bitemporal Quality:  Pressure and throbbing Initial intensity:  If delays treatment, then 8/10; if treats early, then 6/10; denies new headache, thunderclap headache or severe headache that wakes  from sleep. Aura:  none Premonitory Phase:  none Postdrome:  Usually no Associated symptoms:  Nausea, vomiting, blurred vision, photophobia, phonophobia, osmophobia.  Rarely, vomiting or pain is so severe that she has passed out. She has had 3  episodes of left hemiplegic migraine (arm and leg) associated with left sided numbness (including face) with some slurred speech, that resolves by the next day.  Initial duration:  At least 8 hour Initial frequency:  4 to 5 times a month Initial frequency of abortive medication: 4 to 5 days a month Triggers:  Emotional stress, alcohol (certain beer and wine) Relieving factors:  Applying pressure to temples, sometimes sleep, early treatment with Excedrin. Activity:  aggravates     Past NSAIDS:  Advil, naproxen Past analgesics:  Tylenol Past abortive triptans:  Sumatriptan tablet Past abortive ergotamine:  none Past muscle relaxants:  none Past anti-emetic:  none Past antihypertensive medications:  metoprolol Past antidepressant medications:  amitriptyline 100mg  QHS (effective) Past anticonvulsant medications:  none Past anti-CGRP:  Ubrelvy 100mg  (effective) Past vitamins/Herbal/Supplements:  magnesium Past antihistamines/decongestants:  none Other past therapies:  none     Family history of headache:  Mother (migraines) Other history:  Sinus tachycardia since age 62.  Has been evaluated by cardiology.  On metoprolol and Corlandor.  Echocardiogram scheduled.  PAST MEDICAL HISTORY: Past Medical History:  Diagnosis Date   Anxiety    Depression    Epistaxis    Febrile seizure (HCC)    26 y.o.    IBS (irritable bowel syndrome)    Migraines    Ovarian cyst    Scoliosis    Tachycardia     MEDICATIONS: No current outpatient medications on file prior to visit.   No current facility-administered medications on file prior to visit.     ALLERGIES: No Known Allergies  FAMILY HISTORY: Family History  Problem Relation Age of Onset  Sjogren's syndrome Mother    Anxiety disorder Mother    Basal cell carcinoma Mother    Autism Brother    Anxiety disorder Brother    Diabetes Maternal Grandmother    Arthritis Maternal Grandmother    Breast cancer Maternal Grandmother    Skin  cancer Maternal Grandmother       Objective:  Blood pressure 118/79, pulse 100, resp. rate 18, height 5\' 5"  (1.651 m), weight 137 lb (62.1 kg), SpO2 97 %, unknown if currently breastfeeding.  General: No acute distress.  Patient appears well-groomed.   Head:  Normocephalic/atraumatic Eyes:  Fundi examined but not visualized Neck: supple, no paraspinal tenderness, full range of motion Heart:  Regular rate and rhythm Neurological Exam: alert and oriented to person, place, and time.  Speech fluent and not dysarthric, language intact.  CN II-XII intact. Bulk and tone normal, muscle strength 5/5 throughout.  Sensation to light touch intact.  Deep tendon reflexes 2+ throughout.  Finger to nose testing intact.  Gait normal, Romberg negative.   Metta Clines, DO  CC: Marlowe Sax, NP

## 2022-01-21 ENCOUNTER — Ambulatory Visit: Payer: 59 | Admitting: Neurology

## 2022-01-21 ENCOUNTER — Encounter: Payer: Self-pay | Admitting: Neurology

## 2022-01-21 VITALS — BP 118/79 | HR 100 | Resp 18 | Ht 65.0 in | Wt 137.0 lb

## 2022-01-21 DIAGNOSIS — G43409 Hemiplegic migraine, not intractable, without status migrainosus: Secondary | ICD-10-CM

## 2022-01-21 DIAGNOSIS — G43009 Migraine without aura, not intractable, without status migrainosus: Secondary | ICD-10-CM

## 2022-01-21 NOTE — Patient Instructions (Signed)
Excedrin as needed.  Limit use of pain relievers to no more than 2 days out of week to prevent risk of rebound or medication-overuse headache. Keep headache diary FOllow up 6 months

## 2022-05-30 ENCOUNTER — Other Ambulatory Visit: Payer: Self-pay | Admitting: Neurology

## 2022-05-30 ENCOUNTER — Telehealth: Payer: Self-pay

## 2022-05-30 ENCOUNTER — Ambulatory Visit: Payer: 59 | Admitting: Family Medicine

## 2022-05-30 ENCOUNTER — Encounter: Payer: Self-pay | Admitting: Family Medicine

## 2022-05-30 ENCOUNTER — Encounter: Payer: Self-pay | Admitting: Neurology

## 2022-05-30 VITALS — BP 112/74 | HR 93 | Temp 97.9°F | Ht 65.0 in | Wt 139.0 lb

## 2022-05-30 DIAGNOSIS — R1011 Right upper quadrant pain: Secondary | ICD-10-CM

## 2022-05-30 DIAGNOSIS — R197 Diarrhea, unspecified: Secondary | ICD-10-CM

## 2022-05-30 DIAGNOSIS — Z7689 Persons encountering health services in other specified circumstances: Secondary | ICD-10-CM

## 2022-05-30 DIAGNOSIS — R194 Change in bowel habit: Secondary | ICD-10-CM | POA: Diagnosis not present

## 2022-05-30 LAB — COMPREHENSIVE METABOLIC PANEL
ALT: 15 U/L (ref 0–35)
AST: 13 U/L (ref 0–37)
Albumin: 4.4 g/dL (ref 3.5–5.2)
Alkaline Phosphatase: 81 U/L (ref 39–117)
BUN: 11 mg/dL (ref 6–23)
CO2: 25 mEq/L (ref 19–32)
Calcium: 9.7 mg/dL (ref 8.4–10.5)
Chloride: 105 mEq/L (ref 96–112)
Creatinine, Ser: 0.76 mg/dL (ref 0.40–1.20)
GFR: 106.44 mL/min (ref >60.00)
Glucose, Bld: 91 mg/dL (ref 70–99)
Potassium: 4 mEq/L (ref 3.5–5.1)
Sodium: 139 mEq/L (ref 135–145)
Total Bilirubin: 0.3 mg/dL (ref 0.2–1.2)
Total Protein: 7.9 g/dL (ref 6.0–8.3)

## 2022-05-30 LAB — CBC WITH DIFFERENTIAL/PLATELET
Basophils Absolute: 0.1 10*3/uL (ref 0.0–0.1)
Basophils Relative: 0.6 % (ref 0.0–3.0)
Eosinophils Absolute: 0 10*3/uL (ref 0.0–0.7)
Eosinophils Relative: 0.5 % (ref 0.0–5.0)
HCT: 41.9 % (ref 36.0–46.0)
Hemoglobin: 13.8 g/dL (ref 12.0–15.0)
Lymphocytes Relative: 25.7 % (ref 12.0–46.0)
Lymphs Abs: 2.4 10*3/uL (ref 0.7–4.0)
MCHC: 32.9 g/dL (ref 30.0–36.0)
MCV: 84.4 fl (ref 78.0–100.0)
Monocytes Absolute: 0.8 10*3/uL (ref 0.1–1.0)
Monocytes Relative: 8.2 % (ref 3.0–12.0)
Neutro Abs: 6.1 10*3/uL (ref 1.4–7.7)
Neutrophils Relative %: 65 % (ref 43.0–77.0)
Platelets: 347 10*3/uL (ref 150.0–400.0)
RBC: 4.97 Mil/uL (ref 3.87–5.11)
RDW: 13.1 % (ref 11.5–15.5)
WBC: 9.4 10*3/uL (ref 4.0–10.5)

## 2022-05-30 LAB — LIPASE: Lipase: 42 U/L (ref 11.0–59.0)

## 2022-05-30 MED ORDER — AIMOVIG 140 MG/ML ~~LOC~~ SOAJ: 140.0000 mg | SUBCUTANEOUS | 11 refills | Status: DC

## 2022-05-30 NOTE — Progress Notes (Signed)
New Patient Office Visit  Subjective    Patient ID: Virginia Kane, female    DOB: 11-06-1995  Age: 27 y.o. MRN: [id number]  CC:  Chief Complaint  Patient presents with   Establish Care    Has neurologist, migraines started back so will be starting on injections.   Has been having GI issues she would like to talk about.     HPI Charlayne Kopecky presents to establish care No PCP in 3-4 years.   OB/GYN-[place]  Neurologist- Dr. Tomi Likens Cardiologist- tachycardia   GI issues life long. Family hx of IBS and colon polyps.   Diarrhea once every 2 weeks. Mucous in stool and changes in appearance. No blood.  Intermittent abdominal pain and acid reflux.  RUQ pain worse after eating greasy foods. Pain more frequent last summer.  Last week she had an episode and it lasted 2 days.   No constipation lately.   Has IUD.   Recent normal thyroid blood test at her OB/GYN  Sees neurologist for migraines.    Denies fever, chills, fatigue, unexplained weight loss, dizziness, chest pain, palpitations, shortness of breath,  urinary symptoms.      No outpatient encounter medications on file as of 05/30/2022.   No facility-administered encounter medications on file as of 05/30/2022.    Past Medical History:  Diagnosis Date   Allergy    Seasonal   Anxiety    Depression    Epistaxis    Febrile seizure ([place])    27 y.o.    IBS (irritable bowel syndrome)    Migraines    Ovarian cyst    Scoliosis    Tachycardia     Past Surgical History:  Procedure Laterality Date   WISDOM TOOTH EXTRACTION     2017 [facility]     Family History  Problem Relation Age of Onset   Sjogren's syndrome Mother    Anxiety disorder Mother    Basal cell carcinoma Mother    ADD / ADHD Mother    Cancer Mother    Depression Mother    Miscarriages / [place] Mother    Autism Brother    Anxiety disorder Brother    Diabetes Maternal Grandmother    Arthritis Maternal Grandmother     Breast cancer Maternal Grandmother    Skin cancer Maternal Grandmother    Cancer Maternal Grandmother     Social History   Socioeconomic History   Marital status: Married    Spouse name: Not on file   Number of children: 0   Years of education: Not on file   Highest education level: Master's degree (e.g., MA, MS, MEng, MEd, MSW, MBA)  Occupational History   Not on file  Tobacco Use   Smoking status: Never   Smokeless tobacco: Never  Vaping Use   Vaping Use: Never used  Substance and Sexual Activity   Alcohol use: Yes    Alcohol/week: 1.0 standard drink of alcohol    Types: 1 Standard drinks or equivalent per week    Comment: Occasionally 1 drink per week   Drug use: Never   Sexual activity: Yes    Birth control/protection: Condom, I.U.D.  Other Topics Concern   Not on file  Social History Narrative   Social History      Diet? regular      Do you drink/eat things with caffeine? yes      Marital status?           married  What year were you married? 2020      Do you live in a house, apartment, assisted living, condo, trailer, etc.?  house      Is it one or more stories? one      How many persons live in your home? 2       Do you have any pets in your home? (please list) 2 dog 2 lizards      Highest level of education completed? 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       no                               Type & how often? Need to more often       Advanced Directives      Do you have a living will? no      Do you have a DNR form?               no                   If not, do you want to discuss one?no       Do you have signed POA/HPOA for forms? No       Functional Status      Do you have difficulty bathing or dressing yourself? no      Do you have difficulty preparing food or eating? no      Do you have difficulty managing your medications? No       Do you have difficulty  managing your finances? No       Do you have difficulty affording your medications? No       Social Determinants of Radio broadcast assistant Strain: Not on file  Food Insecurity: Not on file  Transportation Needs: Not on file  Physical Activity: Not on file  Stress: Not on file  Social Connections: Not on file  Intimate Partner Violence: Not on file    ROS      Objective    BP 112/74 (BP Location: Left Arm, Patient Position: Sitting, Cuff Size: Large)   Pulse 93   Temp 97.9 F (36.6 C) (Temporal)   Ht 5\' 5"  (1.651 m)   Wt 139 lb (63 kg)   SpO2 99%   BMI 23.13 kg/m   Physical Exam Constitutional:      General: She is not in acute distress.    Appearance: She is not ill-appearing.  HENT:     Mouth/Throat:     Mouth: Mucous membranes are moist.  Eyes:     Extraocular Movements: Extraocular movements intact.     Conjunctiva/sclera: Conjunctivae normal.  Cardiovascular:     Rate and Rhythm: Normal rate and regular rhythm.  Pulmonary:     Effort: Pulmonary effort is normal.     Breath sounds: Normal breath sounds.  Abdominal:     General: Bowel sounds are normal. There is no distension.     Palpations: Abdomen is soft.     Tenderness: There is abdominal tenderness in the right lower quadrant. There is no right CVA tenderness, left CVA tenderness, guarding or rebound. Negative signs include Murphy's sign, McBurney's sign and psoas sign.  Musculoskeletal:     Cervical back: Normal range of motion and neck supple. No tenderness.  Lymphadenopathy:  Cervical: No cervical adenopathy.  Skin:    General: Skin is warm and dry.  Neurological:     General: No focal deficit present.     Mental Status: She is alert and oriented to person, place, and time.  Psychiatric:        Mood and Affect: Mood normal.        Behavior: Behavior normal.        Thought Content: Thought content normal.         Assessment & Plan:   Problem List Items Addressed This Visit    None Visit Diagnoses     Change in bowel habits    -  Primary   Relevant Orders   CBC with Differential/Platelet   Comprehensive metabolic panel   Ambulatory referral to Gastroenterology   RUQ pain       Relevant Orders   CBC with Differential/Platelet   Comprehensive metabolic panel   Lipase   US Abdomen Limited RUQ (LIVER/GB)   Ambulatory referral to Gastroenterology   Diarrhea, unspecified type       Relevant Orders   CBC with Differential/Platelet   Comprehensive metabolic panel   Lipase   Ambulatory referral to Gastroenterology   Encounter to establish care          She is a pleasant 27 year old female who is new to the practice and here to establish care.  Her main concern today is GI symptoms.  Reports a lifelong history of abnormal gastrointestinal function.  She has gone back and forth between constipation and diarrhea.  Recently bothered with changes in bowel habits and the appearance of her stools.  Normal thyroid function at OB/GYN office per patient.  Abdominal exam without red flag symptoms.  Will check CBC, CMP and lipase today.   Right upper quadrant ultrasound ordered.  Referral to GI.  Return if symptoms worsen or fail to improve.   Harland Dingwall, NP-C

## 2022-05-30 NOTE — Patient Instructions (Signed)
Thank you for trusting [place] with your health care.  Please go downstairs for labs before you leave.  You may receive a call to schedule your right upper quadrant abdominal ultrasound.  You will also receive a call from [facility] gastroenterology to schedule an appointment.  Eat a low-fat, bland diet

## 2022-05-30 NOTE — Telephone Encounter (Signed)
Per Dr.Jaffe,I sent in a prescription to [facility] on [address] for Aimovig. It is an autoinjector, like an epi-pen. You administer it yourself (either the thigh, stomach or upper arm at the triceps), every 28 days. You keep it refrigerated and take it out about 30 minutes prior to use.    Pa team please start a PA for Aimovig 140 mg.

## 2022-06-10 ENCOUNTER — Telehealth: Payer: Self-pay

## 2022-06-10 ENCOUNTER — Other Ambulatory Visit (HOSPITAL_COMMUNITY): Payer: Self-pay

## 2022-06-10 NOTE — Telephone Encounter (Signed)
Patient Advocate Encounter   Received notification from [employer]  that prior authorization is required for Aimovig 140MG /ML auto-injectors  Submitted: 06-10-2022 Key [id number]  Status is pending

## 2022-06-10 NOTE — Telephone Encounter (Signed)
Patient Advocate Encounter  Prior Authorization for Aimovig 140MG /ML auto-injectors has been approved through [employer].    Key: [id number]  Effective: 06-10-2022 to 12-11-2022   Test billing returns $40.00 copay for 28 day supply.

## 2022-06-10 NOTE — Telephone Encounter (Signed)
New encounter created for this authorization. All updates will be there Thank you

## 2022-06-11 NOTE — Telephone Encounter (Signed)
Telephone call to patient, Advised of Approval.

## 2022-06-18 ENCOUNTER — Encounter: Payer: Self-pay | Admitting: Nurse Practitioner

## 2022-06-18 ENCOUNTER — Ambulatory Visit
Admission: RE | Admit: 2022-06-18 | Discharge: 2022-06-18 | Disposition: A | Discharge status: Home / Self Care | Payer: 59 | Source: Ambulatory Visit | Attending: Family Medicine | Admitting: Family Medicine

## 2022-06-18 ENCOUNTER — Other Ambulatory Visit: Payer: Self-pay | Admitting: Family Medicine

## 2022-06-18 ENCOUNTER — Encounter: Payer: Self-pay | Admitting: Family Medicine

## 2022-06-18 DIAGNOSIS — R1011 Right upper quadrant pain: Secondary | ICD-10-CM

## 2022-06-18 DIAGNOSIS — K8020 Calculus of gallbladder without cholecystitis without obstruction: Secondary | ICD-10-CM | POA: Insufficient documentation

## 2022-06-18 NOTE — Progress Notes (Signed)
Please let her know that she has gallstones without a current infection.  This may be causing her abdominal pain especially after eating.  I recommend eating a low-fat diet and discussing this with GI.  I referred her last month.  They have tried to contact her to schedule a visit.   If she has any more severe abdominal pain, she should go to the emergency department in case her gallbladder issue is worsening.  I can also refer her to general surgery for evaluation if she would like.  I do recommend seeing GI at their earliest appointment and she will need to call and schedule this

## 2022-06-18 NOTE — Telephone Encounter (Signed)
Pt ok with general surgery referral

## 2022-06-26 ENCOUNTER — Ambulatory Visit: Payer: Self-pay | Admitting: General Surgery

## 2022-06-30 ENCOUNTER — Encounter: Payer: Self-pay | Admitting: Neurology

## 2022-07-01 IMAGING — US US PELVIS COMPLETE TRANSABD/TRANSVAG W DUPLEX
1 series · 13 of 25 positions shown · non-contrast
Comparison: None.

CLINICAL DATA: Left pelvic pain. Last menstrual period 02/15/2020.
replacement therapy.

EXAM:
TRANSABDOMINAL AND TRANSVAGINAL ULTRASOUND OF PELVIS
DOPPLER ULTRASOUND OF OVARIES
TECHNIQUE: Both transabdominal and transvaginal ultrasound examinations of the
pelvis were performed. Transabdominal technique was performed for
global imaging of the pelvis including uterus, ovaries, adnexal
regions, and pelvic cul-de-sac.
It was necessary to proceed with endovaginal exam following the
transabdominal exam to visualize the bilateral ovaries. Color and
duplex Doppler ultrasound was utilized to evaluate blood flow to the
ovaries.

[Series 1: us pelvic complete w transvaginal and torsion righ · 13 of 95 slices shown]
[im 1/95]
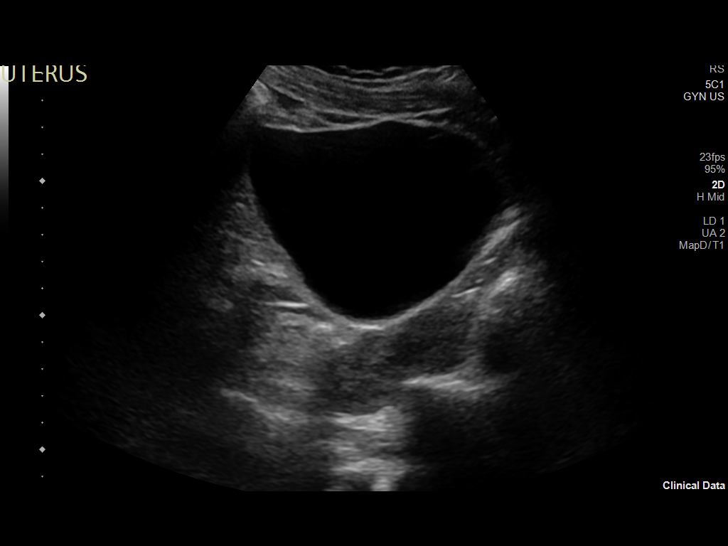
[im 8/95]
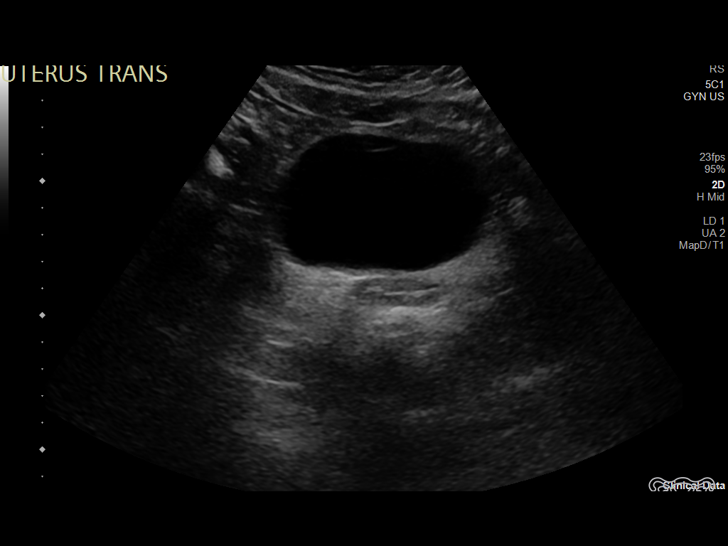
[im 16/95]
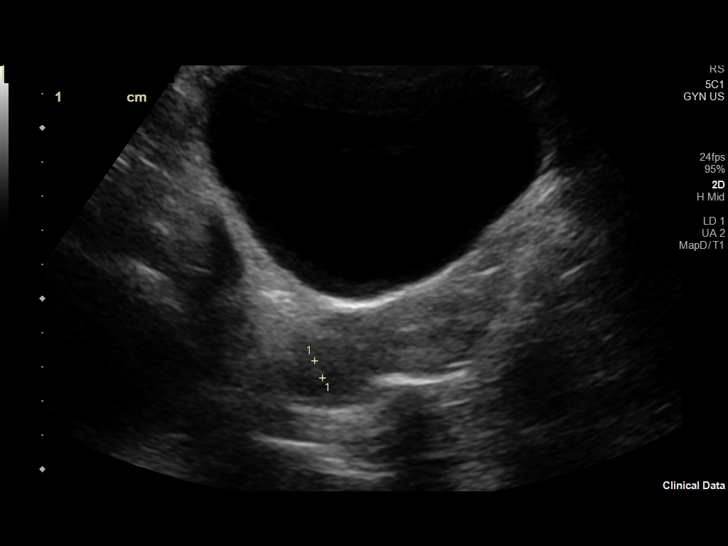
[im 24/95]
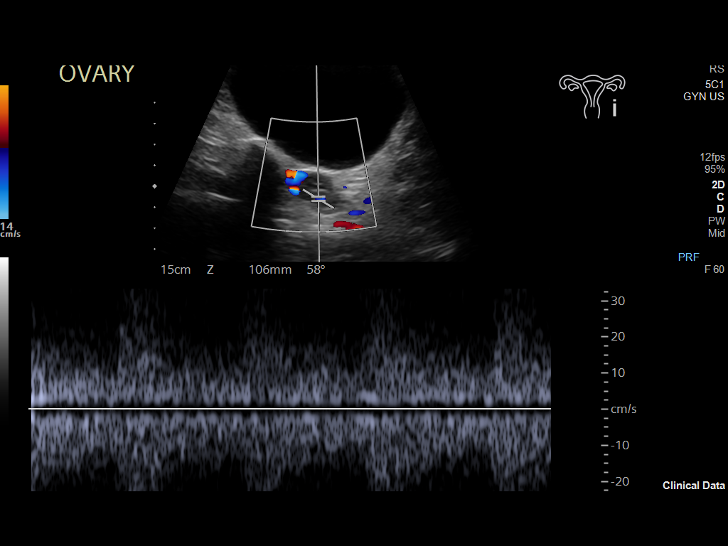
[im 32/95]
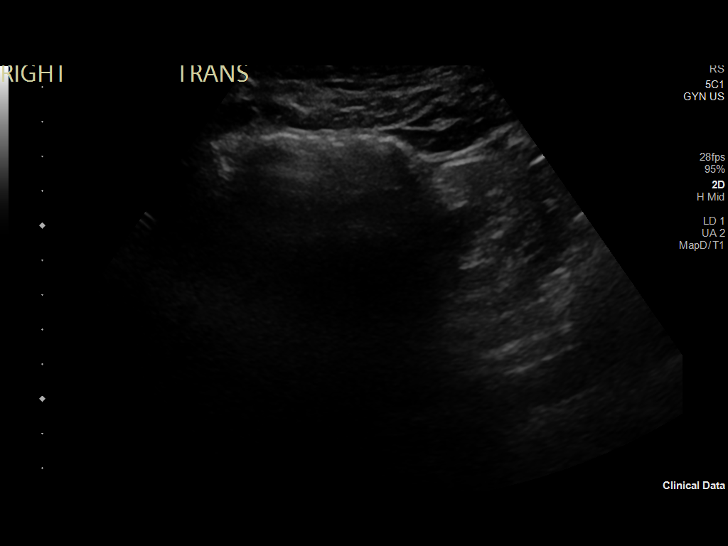
[im 40/95]
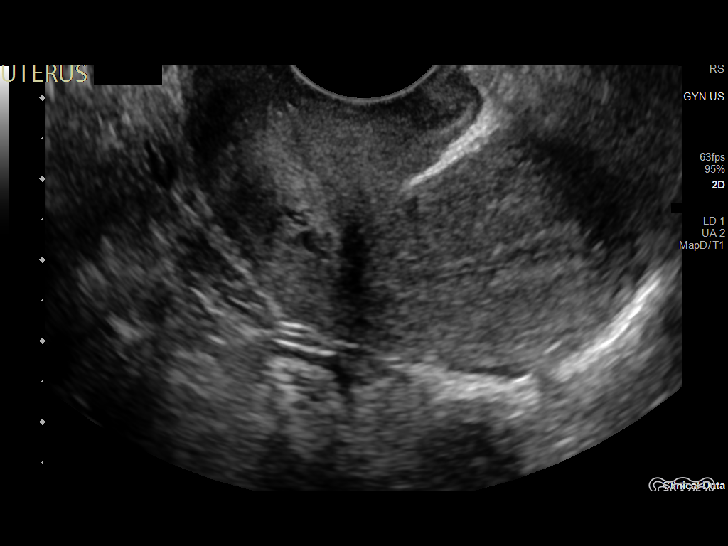
[im 48/95]
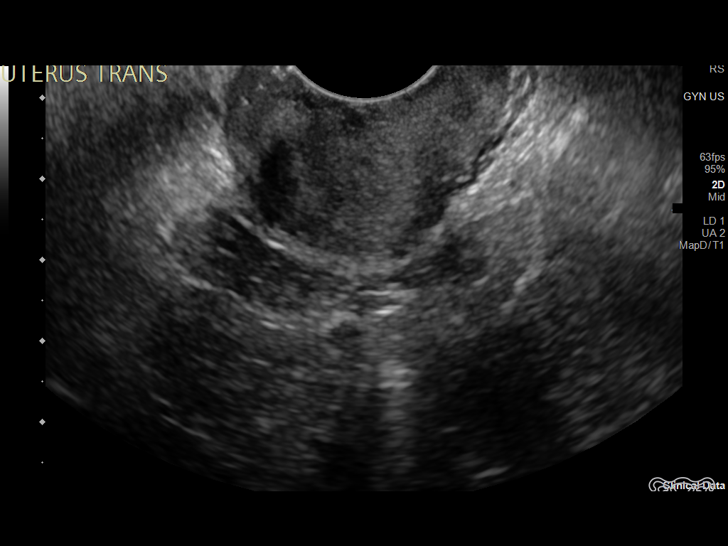
[im 55/95]
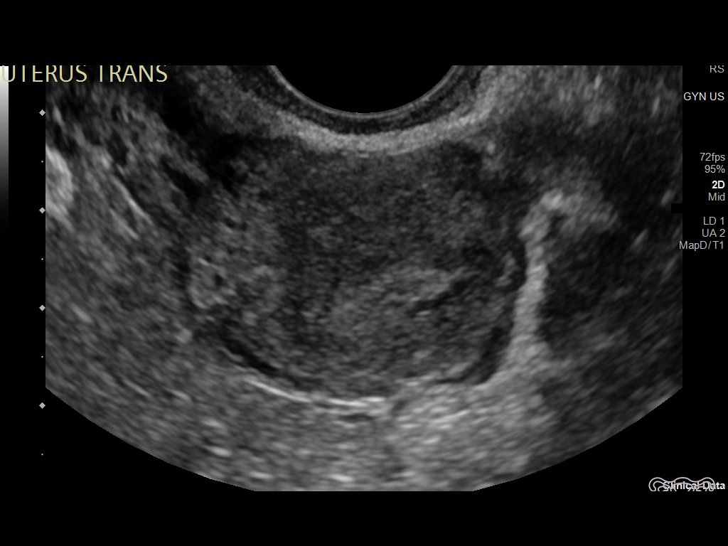
[im 63/95]
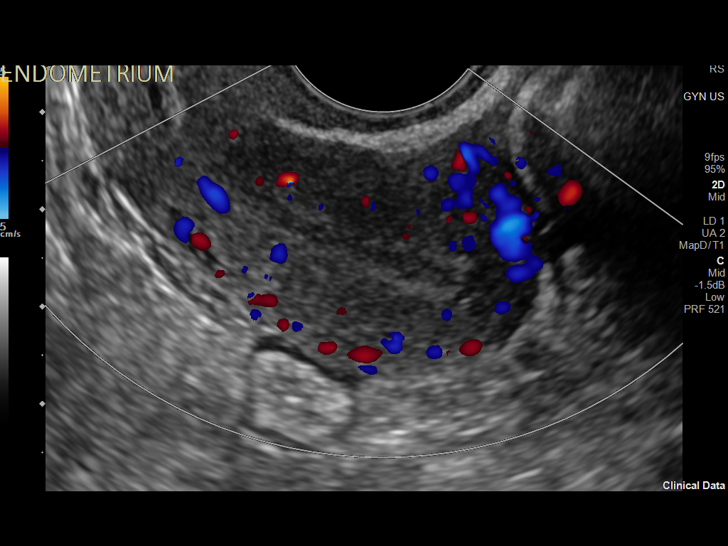
[im 71/95]
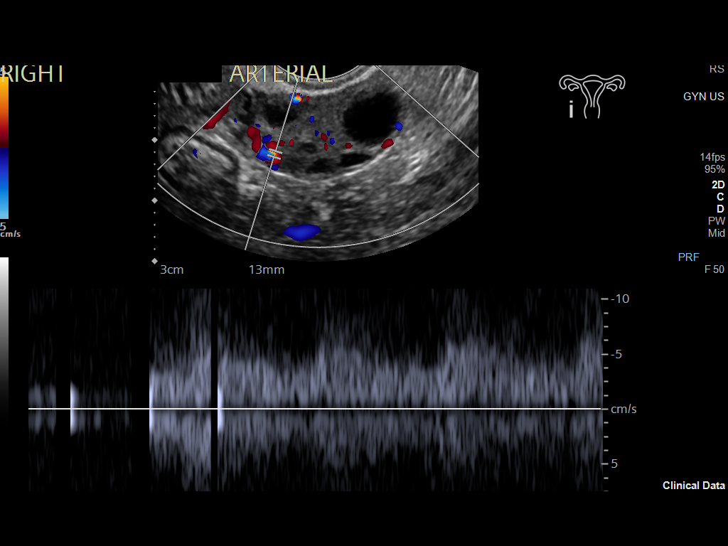
[im 79/95]
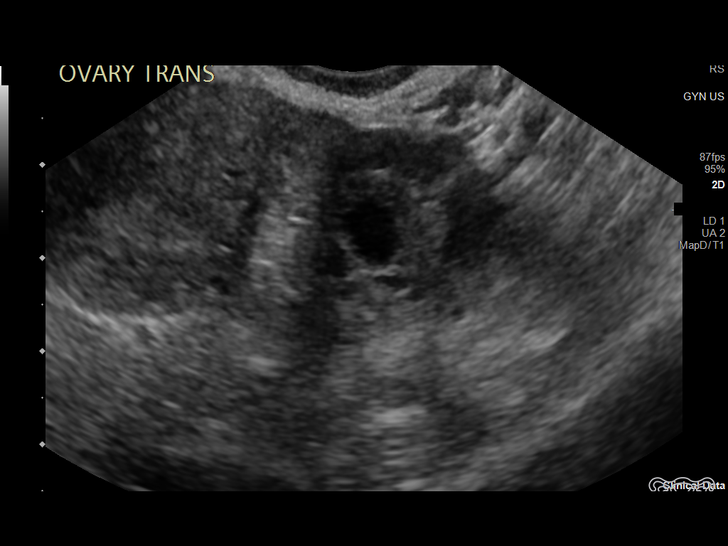
[im 87/95]
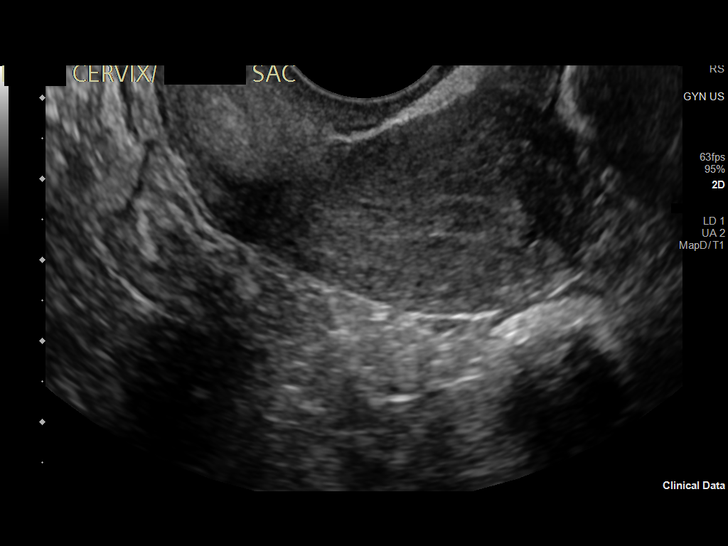
[im 95/95]
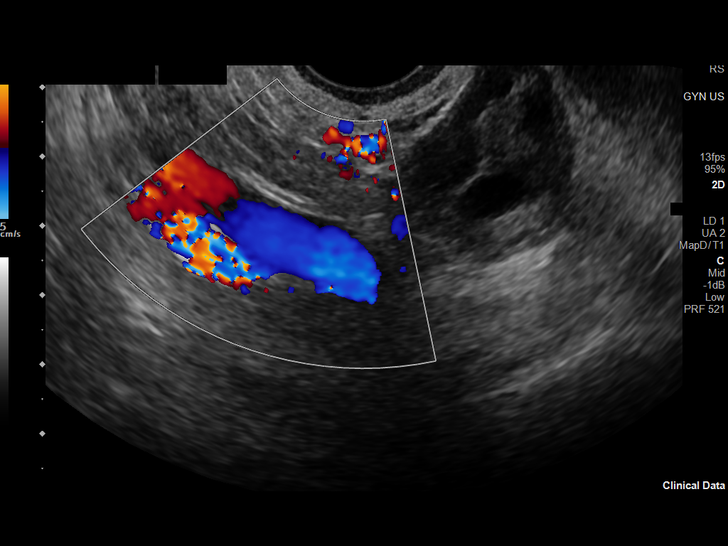

[13 of 25 positions shown; findings below may reference images not displayed]

FINDINGS: Uterus

Measurements: 7.1 x 2.1 x 3cm = volume: 23.7 mL. The uterus is
retroflexed and retroverted no fibroids or other mass visualized.

Endometrium

Thickness: 5 mm.  No focal abnormality visualized.

Right ovary

Measurements: 3.2 x 1.4 x 2 cm = volume: 4.9 mL. Normal
appearance/no adnexal mass.

Left ovary

Measurements: 3 x 2.5 x 2.3 cm = volume: 9 mL. Normal appearance/no
adnexal mass.

Pulsed Doppler evaluation of both ovaries demonstrates normal
low-resistance arterial and venous waveforms.

Other findings

No abnormal free fluid.
IMPRESSION: Unremarkable pelvic ultrasound.

## 2022-07-18 NOTE — Progress Notes (Signed)
Surgical Instructions    Your procedure is scheduled on Friday May 10th.  Report to [facility] Main Entrance "A" at 1115 A.M., then check in with the Admitting office.  Call this number if you have problems the morning of surgery:  [phone]   If you have any questions prior to your surgery date call [phone]: Open Monday-Friday 8am-4pm If you experience any cold or flu symptoms such as cough, fever, chills, shortness of breath, etc. between now and your scheduled surgery, please notify us at the above number     Remember:  Do not eat after midnight the night before your surgery  You may drink clear liquids until 1015am the morning of your surgery.   Clear liquids allowed are: Water, Non-Citrus Juices (without pulp), Carbonated Beverages, Clear Tea, Black Coffee ONLY (NO MILK, CREAM OR POWDERED CREAMER of any kind), and Gatorade    Take these medicines the morning of surgery with A SIP OF WATER:NONE    As of today, STOP taking any Aspirin (unless otherwise instructed by your surgeon) Aleve, Naproxen, Ibuprofen, Motrin, Advil, Goody's, BC's, all herbal medications, fish oil, and all vitamins.           Do not wear jewelry or makeup. Do not wear lotions, powders, perfumes or deodorant. Do not shave 48 hours prior to surgery.   Do not bring valuables to the hospital. Do not wear nail polish, gel polish, artificial nails, or any other type of covering on natural nails (fingers and toes) If you have artificial nails or gel coating that need to be removed by a nail salon, please have this removed prior to surgery. Artificial nails or gel coating may interfere with anesthesia's ability to adequately monitor your vital signs.  [facility] is not responsible for any belongings or valuables.    Do NOT Smoke (Tobacco/Vaping)  24 hours prior to your procedure  If you use a CPAP at night, you may bring your mask for your overnight stay.   Contacts, glasses, hearing aids, dentures or  partials may not be worn into surgery, please bring cases for these belongings   For patients admitted to the hospital, discharge time will be determined by your treatment team.   Patients discharged the day of surgery will not be allowed to drive home, and someone needs to stay with them for 24 hours.   SURGICAL WAITING ROOM VISITATION Patients having surgery or a procedure may have no more than 2 support people in the waiting area - these visitors may rotate.   Children under the age of 58 must have an adult with them who is not the patient. If the patient needs to stay at the hospital during part of their recovery, the visitor guidelines for inpatient rooms apply. Pre-op nurse will coordinate an appropriate time for 1 support person to accompany patient in pre-op.  This support person may not rotate.   Please refer to [email or web address] for the visitor guidelines for Inpatients (after your surgery is over and you are in a regular room).    Special instructions:    Oral Hygiene is also important to reduce your risk of infection.  Remember - BRUSH YOUR TEETH THE MORNING OF SURGERY WITH YOUR REGULAR TOOTHPASTE   - Preparing For Surgery  Before surgery, you can play an important role. Because skin is not sterile, your skin needs to be as free of germs as possible. You can reduce the number of germs on your skin by washing with CHG (chlorahexidine  gluconate) Soap before surgery.  CHG is an antiseptic cleaner which kills germs and bonds with the skin to continue killing germs even after washing.     Please do not use if you have an allergy to CHG or antibacterial soaps. If your skin becomes reddened/irritated stop using the CHG.  Do not shave (including legs and underarms) for at least 48 hours prior to first CHG shower. It is OK to shave your face.  Please follow these instructions carefully.     Shower the NIGHT BEFORE  SURGERY and the MORNING OF SURGERY with CHG Soap.   If you chose to wash your hair, wash your hair first as usual with your normal shampoo. After you shampoo, rinse your hair and body thoroughly to remove the shampoo.  Then [employer] and genitals (private parts) with your normal soap and rinse thoroughly to remove soap.  After that Use CHG Soap as you would any other liquid soap. You can apply CHG directly to the skin and wash gently with a scrungie or a clean washcloth.   Apply the CHG Soap to your body ONLY FROM THE NECK DOWN.  Do not use on open wounds or open sores. Avoid contact with your eyes, ears, mouth and genitals (private parts). Wash Face and genitals (private parts)  with your normal soap.   Wash thoroughly, paying special attention to the area where your surgery will be performed.  Thoroughly rinse your body with warm water from the neck down.  DO NOT shower/wash with your normal soap after using and rinsing off the CHG Soap.  Pat yourself dry with a CLEAN TOWEL.  Wear CLEAN PAJAMAS to bed the night before surgery  Place CLEAN SHEETS on your bed the night before your surgery  DO NOT SLEEP WITH PETS.   Day of Surgery:  Take a shower with CHG soap. Wear Clean/Comfortable clothing the morning of surgery Do not apply any deodorants/lotions.   Remember to brush your teeth WITH YOUR REGULAR TOOTHPASTE.    If you received a COVID test during your pre-op visit, it is requested that you wear a mask when out in public, stay away from anyone that may not be feeling well, and notify your surgeon if you develop symptoms. If you have been in contact with anyone that has tested positive in the last 10 days, please notify your surgeon.    Please read over the following fact sheets that you were given.

## 2022-07-21 ENCOUNTER — Other Ambulatory Visit: Payer: Self-pay

## 2022-07-21 ENCOUNTER — Encounter (HOSPITAL_COMMUNITY): Payer: Self-pay

## 2022-07-21 ENCOUNTER — Encounter (HOSPITAL_COMMUNITY)
Admission: RE | Admit: 2022-07-21 | Discharge: 2022-07-21 | Disposition: A | Discharge status: Home / Self Care | Payer: 59 | Source: Ambulatory Visit | Attending: General Surgery | Admitting: General Surgery

## 2022-07-21 VITALS — BP 105/88 | HR 81 | Temp 98.3°F | Resp 18 | Ht 65.0 in | Wt 137.2 lb

## 2022-07-21 DIAGNOSIS — Z01818 Encounter for other preprocedural examination: Secondary | ICD-10-CM | POA: Insufficient documentation

## 2022-07-21 HISTORY — DX: Other complications of anesthesia, initial encounter: T88.59XA

## 2022-07-21 LAB — CBC
HCT: 44.2 % (ref 36.0–46.0)
Hemoglobin: 14.5 g/dL (ref 12.0–15.0)
MCH: 27.9 pg (ref 26.0–34.0)
MCHC: 32.8 g/dL (ref 30.0–36.0)
MCV: 85 fL (ref 80.0–100.0)
Platelets: 301 10*3/uL (ref 150–400)
RBC: 5.2 MIL/uL — ABNORMAL HIGH (ref 3.87–5.11)
RDW: 12.7 % (ref 11.5–15.5)
WBC: 8.1 10*3/uL (ref 4.0–10.5)
nRBC: 0 % (ref 0.0–0.2)

## 2022-07-21 LAB — BASIC METABOLIC PANEL
Anion gap: 9 (ref 5–15)
BUN: 10 mg/dL (ref 6–20)
CO2: 25 mmol/L (ref 22–32)
Calcium: 9.7 mg/dL (ref 8.9–10.3)
Chloride: 104 mmol/L (ref 98–111)
Creatinine, Ser: 0.84 mg/dL (ref 0.44–1.00)
GFR, Estimated: >60 mL/min (ref >60)
Glucose, Bld: 96 mg/dL (ref 70–99)
Potassium: 4 mmol/L (ref 3.5–5.1)
Sodium: 138 mmol/L (ref 135–145)

## 2022-07-21 NOTE — Progress Notes (Signed)
PCP - Johnnye Lana, NP-L Cardiologist - Kristeen Miss, MD Neurologist: Shon Millet, DO  PPM/ICD - Denies  Chest x-ray - 12/31/2021 EKG - 09/13/2021 Stress Test - Denies ECHO - 02/01/2019 Cardiac Cath - Denies  Sleep Study - Denies  DM: Denies  Blood Thinner Instructions: N/A Aspirin Instructions: N/A  ERAS Protcol - Yes PRE-SURGERY Ensure or G2- No drink   COVID TEST- N/A   Anesthesia review: Yes, cardiac history.  Patient denies shortness of breath, fever, cough and chest pain at PAT appointment   All instructions explained to the patient, with a verbal understanding of the material. Patient agrees to go over the instructions while at home for a better understanding.The opportunity to ask questions was provided.

## 2022-07-21 NOTE — Progress Notes (Unsigned)
NEUROLOGY FOLLOW UP OFFICE NOTE  Virginia Kane [id number]  Assessment/Plan:   1.  Migraine without aura, without status migrainosus, not intractable 2.  Hemiplegic migraine, without status migrainosus, not intractable    Migraine prevention:  Started Aimovig 140mg  Migraine rescue:  Restart Ubrelvy 100mg .  Due to hemiplegic migraines, cannot take triptans.  Previously effective.   Limit use of pain relievers to no more than 2 days out of week to prevent risk of rebound or medication-overuse headache. Keep headache diary Follow up 6 months.    Subjective:  Virginia Kane is a 27 year old female with sinus tachycardia, depression and anxiety who follows up for hemiplegic migraine.   UPDATE: Migraines returned.  She had 12 migraines in less than 3 months.  Worse around her cycle.  4 to 5 in the last 30 days.  Most moderate, one was severe.  Usually starts to improve after 30 minutes of taking Excedrin.  She just started Aimovig on Sunday.  She had a vasovagal response to the shot.  She felt lightheaded and vomited.     Current NSAIDS:  none Current analgesics:  Excedrin Current triptans:  none Current ergotamine:  none Current anti-emetic:  none Current muscle relaxants:  none Current anti-anxiolytic:  none Current sleep aide:  none Current Antihypertensive medications:  None Current Antidepressant medications:  none Current Anticonvulsant medications:  None Current anti-CGRP:  Aimovig 140mg   Current Vitamins/Herbal/Supplements:  none Current Antihistamines/Decongestants:  none Other therapy:  none Hormone/birth control:  Mirena     Diet:  Drinks 2 big water bottles daily.  Now mostly vegan diet.       HISTORY:  Onset:  Migraines since childhood.  Relatively well-controlled Location:  Holocephalic but primarily bitemporal Quality:  Pressure and throbbing Initial intensity:  If delays treatment, then 8/10; if treats early, then 6/10; denies new headache, thunderclap  headache or severe headache that wakes  from sleep. Aura:  none Premonitory Phase:  none Postdrome:  Usually no Associated symptoms:  Nausea, vomiting, blurred vision, photophobia, phonophobia, osmophobia.  Rarely, vomiting or pain is so severe that she has passed out. She has had 3 episodes of left hemiplegic migraine (arm and leg) associated with left sided numbness (including face) with some slurred speech, that resolves by the next day.  Initial duration:  At least 8 hour Initial frequency:  4 to 5 times a month Initial frequency of abortive medication: 4 to 5 days a month Triggers:  Emotional stress, alcohol (certain beer and wine) Relieving factors:  Applying pressure to temples, sometimes sleep, early treatment with Excedrin. Activity:  aggravates     Past NSAIDS:  Advil, naproxen Past analgesics:  Tylenol Past abortive triptans:  Sumatriptan tablet Past abortive ergotamine:  none Past muscle relaxants:  none Past anti-emetic:  none Past antihypertensive medications:  metoprolol Past antidepressant medications:  amitriptyline 100mg  QHS (effective) Past anticonvulsant medications:  none Past anti-CGRP:  Ubrelvy 100mg  (effective) Past vitamins/Herbal/Supplements:  magnesium Past antihistamines/decongestants:  none Other past therapies:  none     Family history of headache:  Mother (migraines) Other history:  Sinus tachycardia since age 36.  Has been evaluated by cardiology.  On metoprolol and Cor[place]or.  Echocardiogram scheduled.  PAST MEDICAL HISTORY: Past Medical History:  Diagnosis Date   Allergy    Seasonal   Anxiety    Depression    Epistaxis    Febrile seizure (HCC)    27 y.o.    IBS (irritable bowel syndrome)    Migraines  Ovarian cyst    Scoliosis    Tachycardia     MEDICATIONS: Current Outpatient Medications on File Prior to Visit  Medication Sig Dispense Refill   aspirin-acetaminophen-caffeine (EXCEDRIN MIGRAINE) 250-250-65 MG tablet Take 1-2 tablets  by mouth every 6 (six) hours as needed for migraine.     Erenumab-aooe (AIMOVIG) 140 MG/ML SOAJ Inject 140 mg into the skin every 28 (twenty-eight) days. 1.12 mL 11   levonorgestrel (MIRENA) 20 MCG/DAY IUD 1 each by Intrauterine route once.     No current facility-administered medications on file prior to visit.     ALLERGIES: No Known Allergies  FAMILY HISTORY: Family History  Problem Relation Age of Onset   Sjogren's syndrome Mother    Anxiety disorder Mother    Basal cell carcinoma Mother    ADD / ADHD Mother    Cancer Mother    Depression Mother    Miscarriages / [place] Mother    Autism Brother    Anxiety disorder Brother    Diabetes Maternal Grandmother    Arthritis Maternal Grandmother    Breast cancer Maternal Grandmother    Skin cancer Maternal Grandmother    Cancer Maternal Grandmother       Objective:  Blood pressure 108/74, pulse 68, height 5\' 2"  (1.575 m), weight 136 lb 9.6 oz (62 kg), last menstrual period 07/11/2022, SpO2 98 % General: No acute distress.  Patient appears well-groomed.    Virginia Millet, DO  CC: Virginia Blend, NP-C

## 2022-07-22 ENCOUNTER — Ambulatory Visit: Payer: 59 | Admitting: Neurology

## 2022-07-22 ENCOUNTER — Encounter: Payer: Self-pay | Admitting: Neurology

## 2022-07-22 VITALS — BP 108/74 | HR 68 | Ht 62.0 in | Wt 136.6 lb

## 2022-07-22 DIAGNOSIS — G43409 Hemiplegic migraine, not intractable, without status migrainosus: Secondary | ICD-10-CM

## 2022-07-22 DIAGNOSIS — G43009 Migraine without aura, not intractable, without status migrainosus: Secondary | ICD-10-CM | POA: Diagnosis not present

## 2022-07-22 MED ORDER — UBRELVY 100 MG PO TABS
1.0000 | ORAL_TABLET | ORAL | 11 refills | Status: DC | PRN
Start: 2022-07-22 — End: 2023-06-08

## 2022-07-22 NOTE — Patient Instructions (Signed)
Aimovig every 28 days Ubrelvy as needed. Keep headache diary Follow up 6 months

## 2022-07-24 ENCOUNTER — Ambulatory Visit: Payer: 59 | Admitting: Family Medicine

## 2022-07-25 ENCOUNTER — Ambulatory Visit (HOSPITAL_COMMUNITY)
Admission: RE | Admit: 2022-07-25 | Discharge: 2022-07-25 | Disposition: A | Discharge status: Home / Self Care | Payer: 59 | Attending: General Surgery | Admitting: General Surgery

## 2022-07-25 ENCOUNTER — Ambulatory Visit (HOSPITAL_COMMUNITY): Payer: 59

## 2022-07-25 ENCOUNTER — Other Ambulatory Visit: Payer: Self-pay

## 2022-07-25 ENCOUNTER — Ambulatory Visit (HOSPITAL_BASED_OUTPATIENT_CLINIC_OR_DEPARTMENT_OTHER): Payer: 59 | Admitting: Anesthesiology

## 2022-07-25 ENCOUNTER — Ambulatory Visit (HOSPITAL_COMMUNITY): Payer: 59 | Admitting: Physician Assistant

## 2022-07-25 ENCOUNTER — Encounter (HOSPITAL_COMMUNITY)
Admission: RE | Disposition: A | Discharge status: Home / Self Care | Payer: Self-pay | Source: Home / Self Care | Attending: General Surgery

## 2022-07-25 ENCOUNTER — Encounter (HOSPITAL_COMMUNITY): Payer: Self-pay | Admitting: General Surgery

## 2022-07-25 DIAGNOSIS — F418 Other specified anxiety disorders: Secondary | ICD-10-CM

## 2022-07-25 DIAGNOSIS — K8020 Calculus of gallbladder without cholecystitis without obstruction: Secondary | ICD-10-CM

## 2022-07-25 DIAGNOSIS — K8010 Calculus of gallbladder with chronic cholecystitis without obstruction: Secondary | ICD-10-CM | POA: Insufficient documentation

## 2022-07-25 HISTORY — PX: CHOLECYSTECTOMY: SHX55

## 2022-07-25 LAB — POCT PREGNANCY, URINE: Preg Test, Ur: NEGATIVE

## 2022-07-25 SURGERY — LAPAROSCOPIC CHOLECYSTECTOMY WITH INTRAOPERATIVE CHOLANGIOGRAM
Anesthesia: General | Site: Abdomen

## 2022-07-25 MED ORDER — SUGAMMADEX SODIUM 200 MG/2ML IV SOLN: INTRAVENOUS | Status: DC | PRN

## 2022-07-25 MED ORDER — LIDOCAINE 2% (20 MG/ML) 5 ML SYRINGE
INTRAMUSCULAR | Status: DC | PRN
  Administered 2022-07-25: 20 mg via INTRAVENOUS

## 2022-07-25 MED ORDER — FENTANYL CITRATE (PF) 100 MCG/2ML IJ SOLN
25.0000 ug | INTRAMUSCULAR | Status: DC | PRN
  Administered 2022-07-25: 50 ug via INTRAVENOUS

## 2022-07-25 MED ORDER — CELECOXIB 200 MG PO CAPS
200.0000 mg | ORAL_CAPSULE | ORAL | Status: AC
  Administered 2022-07-25: 200 mg via ORAL
  Filled 2022-07-25: qty 1

## 2022-07-25 MED ORDER — ORAL CARE MOUTH RINSE: 15.0000 mL | Freq: Once | OROMUCOSAL | Status: AC

## 2022-07-25 MED ORDER — CHLORHEXIDINE GLUCONATE CLOTH 2 % EX PADS: 6.0000 | MEDICATED_PAD | Freq: Once | CUTANEOUS | Status: DC

## 2022-07-25 MED ORDER — FENTANYL CITRATE (PF) 100 MCG/2ML IJ SOLN
INTRAMUSCULAR | Status: AC
  Filled 2022-07-25: qty 2

## 2022-07-25 MED ORDER — GABAPENTIN 300 MG PO CAPS
300.0000 mg | ORAL_CAPSULE | ORAL | Status: AC
  Administered 2022-07-25: 300 mg via ORAL
  Filled 2022-07-25: qty 1

## 2022-07-25 MED ORDER — OXYCODONE HCL 5 MG PO TABS: 5.0000 mg | ORAL_TABLET | Freq: Once | ORAL | Status: DC | PRN

## 2022-07-25 MED ORDER — ONDANSETRON HCL 4 MG/2ML IJ SOLN
INTRAMUSCULAR | Status: DC | PRN
  Administered 2022-07-25: 4 mg via INTRAVENOUS

## 2022-07-25 MED ORDER — ACETAMINOPHEN 500 MG PO TABS: 1000.0000 mg | ORAL_TABLET | Freq: Once | ORAL | Status: AC

## 2022-07-25 MED ORDER — PROPOFOL 10 MG/ML IV BOLUS
INTRAVENOUS | Status: DC | PRN
  Administered 2022-07-25 (×2): 100 mg via INTRAVENOUS

## 2022-07-25 MED ORDER — LACTATED RINGERS IV SOLN: INTRAVENOUS | Status: DC

## 2022-07-25 MED ORDER — BUPIVACAINE HCL 0.25 % IJ SOLN
INTRAMUSCULAR | Status: DC | PRN
  Administered 2022-07-25: 15 mL

## 2022-07-25 MED ORDER — FENTANYL CITRATE (PF) 250 MCG/5ML IJ SOLN
INTRAMUSCULAR | Status: AC
  Filled 2022-07-25: qty 5

## 2022-07-25 MED ORDER — SODIUM CHLORIDE 0.9 % IR SOLN
Status: DC | PRN
  Administered 2022-07-25: 1000 mL

## 2022-07-25 MED ORDER — MIDAZOLAM HCL 2 MG/2ML IJ SOLN
INTRAMUSCULAR | Status: DC | PRN
  Administered 2022-07-25: 2 mg via INTRAVENOUS

## 2022-07-25 MED ORDER — CHLORHEXIDINE GLUCONATE 0.12 % MT SOLN
OROMUCOSAL | Status: AC
  Administered 2022-07-25: 15 mL via OROMUCOSAL
  Filled 2022-07-25: qty 15

## 2022-07-25 MED ORDER — AMISULPRIDE (ANTIEMETIC) 5 MG/2ML IV SOLN: 10.0000 mg | Freq: Once | INTRAVENOUS | Status: DC | PRN

## 2022-07-25 MED ORDER — ACETAMINOPHEN 500 MG PO TABS: 1000.0000 mg | ORAL_TABLET | ORAL | Status: AC

## 2022-07-25 MED ORDER — PHENYLEPHRINE 80 MCG/ML (10ML) SYRINGE FOR IV PUSH (FOR BLOOD PRESSURE SUPPORT)
PREFILLED_SYRINGE | INTRAVENOUS | Status: DC | PRN
  Administered 2022-07-25: 80 ug via INTRAVENOUS

## 2022-07-25 MED ORDER — MIDAZOLAM HCL 2 MG/2ML IJ SOLN
INTRAMUSCULAR | Status: AC
  Filled 2022-07-25: qty 2

## 2022-07-25 MED ORDER — FENTANYL CITRATE (PF) 250 MCG/5ML IJ SOLN
INTRAMUSCULAR | Status: DC | PRN
  Administered 2022-07-25: 100 ug via INTRAVENOUS

## 2022-07-25 MED ORDER — DEXAMETHASONE SODIUM PHOSPHATE 10 MG/ML IJ SOLN
INTRAMUSCULAR | Status: DC | PRN
  Administered 2022-07-25: 10 mg via INTRAVENOUS

## 2022-07-25 MED ORDER — CHLORHEXIDINE GLUCONATE 0.12 % MT SOLN: 15.0000 mL | Freq: Once | OROMUCOSAL | Status: AC

## 2022-07-25 MED ORDER — OXYCODONE HCL 5 MG PO TABS: 5.0000 mg | ORAL_TABLET | Freq: Four times a day (QID) | ORAL | 0 refills | Status: DC | PRN

## 2022-07-25 MED ORDER — CEFAZOLIN SODIUM-DEXTROSE 2-4 GM/100ML-% IV SOLN
INTRAVENOUS | Status: AC
  Filled 2022-07-25: qty 100

## 2022-07-25 MED ORDER — ACETAMINOPHEN 500 MG PO TABS
ORAL_TABLET | ORAL | Status: AC
  Administered 2022-07-25: 1000 mg via ORAL
  Filled 2022-07-25: qty 2

## 2022-07-25 MED ORDER — OXYCODONE HCL 5 MG/5ML PO SOLN: 5.0000 mg | Freq: Once | ORAL | Status: DC | PRN

## 2022-07-25 MED ORDER — CEFAZOLIN SODIUM-DEXTROSE 2-4 GM/100ML-% IV SOLN
2.0000 g | INTRAVENOUS | Status: AC
  Administered 2022-07-25: 2 g via INTRAVENOUS

## 2022-07-25 MED ORDER — ROCURONIUM BROMIDE 10 MG/ML (PF) SYRINGE
PREFILLED_SYRINGE | INTRAVENOUS | Status: DC | PRN
  Administered 2022-07-25: 20 mg via INTRAVENOUS
  Administered 2022-07-25: 50 mg via INTRAVENOUS

## 2022-07-25 MED ORDER — SODIUM CHLORIDE 0.9 % IV SOLN: INTRAVENOUS | Status: DC | PRN

## 2022-07-25 MED ORDER — BUPIVACAINE HCL (PF) 0.25 % IJ SOLN
INTRAMUSCULAR | Status: AC
  Filled 2022-07-25: qty 30

## 2022-07-25 MED ORDER — 0.9 % SODIUM CHLORIDE (POUR BTL) OPTIME
TOPICAL | Status: DC | PRN
  Administered 2022-07-25: 1000 mL

## 2022-07-25 MED ORDER — SUGAMMADEX SODIUM 200 MG/2ML IV SOLN
INTRAVENOUS | Status: DC | PRN
  Administered 2022-07-25: 200 mg via INTRAVENOUS

## 2022-07-25 SURGICAL SUPPLY — 40 items
ADH SKN CLS APL DERMABOND .7 (GAUZE/BANDAGES/DRESSINGS) ×1
APL PRP STRL LF DISP 70% ISPRP (MISCELLANEOUS) ×1
APPLIER CLIP 5 13 M/L LIGAMAX5 (MISCELLANEOUS) ×1
APR CLP MED LRG 5 ANG JAW (MISCELLANEOUS) ×1
BAG COUNTER SPONGE SURGICOUNT (BAG) ×1 IMPLANT
BAG SPNG CNTER NS LX DISP (BAG) ×1
CANISTER SUCT 3000ML PPV (MISCELLANEOUS) ×1 IMPLANT
CATH REDDICK CHOLANGI 4FR 50CM (CATHETERS) ×1 IMPLANT
CHLORAPREP W/TINT 26 (MISCELLANEOUS) ×1 IMPLANT
CLIP APPLIE 5 13 M/L LIGAMAX5 (MISCELLANEOUS) ×1 IMPLANT
COVER MAYO STAND STRL (DRAPES) ×1 IMPLANT
COVER SURGICAL LIGHT HANDLE (MISCELLANEOUS) ×1 IMPLANT
DERMABOND ADVANCED .7 DNX12 (GAUZE/BANDAGES/DRESSINGS) ×1 IMPLANT
DRAPE C-ARM 42X120 X-RAY (DRAPES) ×1 IMPLANT
ELECT REM PT RETURN 9FT ADLT (ELECTROSURGICAL) ×1
ELECTRODE REM PT RTRN 9FT ADLT (ELECTROSURGICAL) ×1 IMPLANT
GLOVE BIO SURGEON STRL SZ7.5 (GLOVE) ×1 IMPLANT
GOWN STRL REUS W/ TWL LRG LVL3 (GOWN DISPOSABLE) ×3 IMPLANT
GOWN STRL REUS W/TWL LRG LVL3 (GOWN DISPOSABLE) ×3
IRRIG SUCT STRYKERFLOW 2 WTIP (MISCELLANEOUS) ×1
IRRIGATION SUCT STRKRFLW 2 WTP (MISCELLANEOUS) ×1 IMPLANT
IV CATH 14GX2 1/4 (CATHETERS) ×1 IMPLANT
KIT BASIN OR (CUSTOM PROCEDURE TRAY) ×1 IMPLANT
KIT TURNOVER KIT B (KITS) ×1 IMPLANT
NS IRRIG 1000ML POUR BTL (IV SOLUTION) ×1 IMPLANT
PAD ARMBOARD 7.5X6 YLW CONV (MISCELLANEOUS) ×1 IMPLANT
SCISSORS LAP 5X35 DISP (ENDOMECHANICALS) ×1 IMPLANT
SET TUBE SMOKE EVAC HIGH FLOW (TUBING) ×1 IMPLANT
SLEEVE Z-THREAD 5X100MM (TROCAR) ×2 IMPLANT
SPECIMEN JAR SMALL (MISCELLANEOUS) ×1 IMPLANT
SUT MNCRL AB 4-0 PS2 18 (SUTURE) ×1 IMPLANT
SYS BAG RETRIEVAL 10MM (BASKET) ×1
SYSTEM BAG RETRIEVAL 10MM (BASKET) ×1 IMPLANT
TOWEL GREEN STERILE (TOWEL DISPOSABLE) ×1 IMPLANT
TOWEL GREEN STERILE FF (TOWEL DISPOSABLE) ×1 IMPLANT
TRAY LAPAROSCOPIC MC (CUSTOM PROCEDURE TRAY) ×1 IMPLANT
TROCAR BALLN 12MMX100 BLUNT (TROCAR) ×1 IMPLANT
TROCAR Z-THREAD OPTICAL 5X100M (TROCAR) ×1 IMPLANT
WARMER LAPAROSCOPE (MISCELLANEOUS) ×1 IMPLANT
WATER STERILE IRR 1000ML POUR (IV SOLUTION) ×1 IMPLANT

## 2022-07-25 NOTE — Anesthesia Procedure Notes (Signed)
Procedure Name: Intubation Date/Time: 07/25/2022 1:00 PM  Performed by: Macie Burows, CRNAPre-anesthesia Checklist: Patient identified, Emergency Drugs available, Suction available and Patient being monitored Patient Re-evaluated:Patient Re-evaluated prior to induction Oxygen Delivery Method: Circle system utilized Preoxygenation: Pre-oxygenation with 100% oxygen Induction Type: IV induction Ventilation: Mask ventilation without difficulty Laryngoscope Size: Mac and 4 Grade View: Grade I Tube type: Oral Tube size: 7.0 mm Number of attempts: 1 Airway Equipment and Method: Stylet and Oral airway Placement Confirmation: ETT inserted through vocal cords under direct vision, positive ETCO2 and breath sounds checked- equal and bilateral Secured at: 21 cm Tube secured with: Tape Dental Injury: Teeth and Oropharynx as per pre-operative assessment

## 2022-07-25 NOTE — H&P (Signed)
REFERRING PHYSICIAN: Avanell Shackleton, NP PROVIDER: Lindell Noe, MD MRN: [id number] DOB: May 25, 1995  Subjective   Chief Complaint: Cholelithiasis  History of Present Illness: Virginia Kane is a 27 y.o. female who is seen today as an office consultation for evaluation of Cholelithiasis  We are asked to see the patient in consultation by Dr. Beather Arbour to evaluate her for gallstones. The patient is a 27 year old white female who has been having intermittent right upper quadrant pain since last summer. Sometimes the pain is described as severe but sometimes it is mild. The pain seems to radiate much of the time into the right shoulder. The pain has been associated with some nausea and vomiting at times. She recently underwent an ultrasound which did show stones in the gallbladder with a 2 cm stone near the gallbladder neck. Her liver functions were normal. There was no evidence of thickening of the gallbladder wall. She is otherwise in good health and does not smoke.  Review of Systems: A complete review of systems was obtained from the patient. I have reviewed this information and discussed as appropriate with the patient. See HPI as well for other ROS.  ROS   Medical History: Past Medical History:  Diagnosis Date  Anxiety  Arrhythmia  Seizures (CMS/HHS-HCC)   Patient Active Problem List  Diagnosis  Calculus of gallbladder without cholecystitis without obstruction   History reviewed. No pertinent surgical history.   No Known Allergies  No current outpatient medications on file prior to visit.   No current facility-administered medications on file prior to visit.   Family History  Problem Relation Age of Onset  Skin cancer Mother  Skin cancer Maternal Grandmother  High blood pressure (Hypertension) Maternal Grandmother  Hyperlipidemia (Elevated cholesterol) Maternal Grandmother  Diabetes Maternal Grandmother  Breast cancer Maternal Grandmother    Social History    Tobacco Use  Smoking Status Never  Smokeless Tobacco Never    Social History   Socioeconomic History  Marital status: Married  Tobacco Use  Smoking status: Never  Smokeless tobacco: Never  Substance and Sexual Activity  Alcohol use: Yes  Comment: 1-2 drinks weekly  Drug use: Never   Objective:   Vitals:  BP: 120/80  Pulse: (!) 114  Temp: 36.9 C (98.5 F)  Weight: 62.5 kg (137 lb 12.8 oz)  Height: 165.1 cm (5\' 5" )  PainSc: 0-No pain   Body mass index is 22.93 kg/m.  Physical Exam Vitals reviewed.  Constitutional:  General: She is not in acute distress. Appearance: Normal appearance.  HENT:  Head: Normocephalic and atraumatic.  Right Ear: External ear normal.  Left Ear: External ear normal.  Nose: Nose normal.  Mouth/Throat:  Mouth: Mucous membranes are moist.  Pharynx: Oropharynx is clear.  Eyes:  General: No scleral icterus. Extraocular Movements: Extraocular movements intact.  Conjunctiva/sclera: Conjunctivae normal.  Pupils: Pupils are equal, round, and reactive to light.  Cardiovascular:  Rate and Rhythm: Normal rate and regular rhythm.  Pulses: Normal pulses.  Heart sounds: Normal heart sounds.  Pulmonary:  Effort: Pulmonary effort is normal. No respiratory distress.  Breath sounds: Normal breath sounds.  Abdominal:  General: Bowel sounds are normal.  Palpations: Abdomen is soft.  Tenderness: There is no abdominal tenderness.  Comments: There is mild tenderness to palpation in the right upper quadrant. There is no palpable mass.  Musculoskeletal:  General: No swelling, tenderness or deformity. Normal range of motion.  Cervical back: Normal range of motion and neck supple.  Skin: General: Skin is warm  and dry.  Coloration: Skin is not jaundiced.  Neurological:  General: No focal deficit present.  Mental Status: She is alert and oriented to person, place, and time.  Psychiatric:  Mood and Affect: Mood normal.  Behavior: Behavior normal.      Labs, Imaging and Diagnostic Testing:  Assessment and Plan:   Diagnoses and all orders for this visit:  Calculus of gallbladder without cholecystitis without obstruction - [facility] Case Posting Request; Future   The patient appears to have symptomatic gallstones. Because of the risk of further painful episodes and possible pancreatitis I feel she would benefit from having her gallbladder removed. She would also like to have this done. I have discussed with her in detail the risks and benefits of the operation to remove the gallbladder as well as some of the technical aspects including the risk of common bile duct injury and she understands and wishes to proceed. We will begin surgical scheduling.

## 2022-07-25 NOTE — Op Note (Signed)
07/25/2022  1:51 PM  PATIENT:  Virginia Kane  27 y.o. female  PRE-OPERATIVE DIAGNOSIS:  GALLSTONES  POST-OPERATIVE DIAGNOSIS:  GALLSTONES  PROCEDURE:  Procedure(s): LAPAROSCOPIC CHOLECYSTECTOMY (N/A)  SURGEON:  Surgeon(s) and Role:    * Griselda Miner, MD - Primary  PHYSICIAN ASSISTANT:   ASSISTANTS: Berenda Morale, RNFA   ANESTHESIA:   local and general  EBL:  minimal   BLOOD ADMINISTERED:none  DRAINS: none   LOCAL MEDICATIONS USED:  MARCAINE     SPECIMEN:  Source of Specimen:  gallbladder  DISPOSITION OF SPECIMEN:  PATHOLOGY  COUNTS:  YES  TOURNIQUET:  * No tourniquets in log *  DICTATION: .Dragon Dictation    Procedure: After informed consent was obtained the patient was brought to the operating room and placed in the supine position on the operating room table. After adequate induction of general anesthesia the patient's abdomen was prepped with ChloraPrep allowed to dry and draped in usual sterile manner. An appropriate timeout was performed. The area below the umbilicus was infiltrated with quarter percent  Marcaine. A small incision was made with a 15 blade knife. The incision was carried down through the subcutaneous tissue bluntly with a hemostat and [employer] retractors. The linea alba was identified. The linea alba was incised with a 15 blade knife and each side was grasped with Coker clamps. The preperitoneal space was then probed with a hemostat until the peritoneum was opened and access was gained to the abdominal cavity. A 0 Vicryl pursestring stitch was placed in the fascia surrounding the opening. A Hassan cannula was then placed through the opening and anchored in place with the previously placed Vicryl purse string stitch. The abdomen was insufflated with carbon dioxide without difficulty. A laparoscope was inserted through the [facility] cannula in the right upper quadrant was inspected. Next the epigastric region was infiltrated with % Marcaine. A small  incision was made with a 15 blade knife. A 5 mm port was placed bluntly through this incision into the abdominal cavity under direct vision. Next 2 sites were chosen laterally on the right side of the abdomen for placement of 5 mm ports. Each of these areas was infiltrated with quarter percent Marcaine. Small stab incisions were made with a 15 blade knife. 5 mm ports were then placed bluntly through these incisions into the abdominal cavity under direct vision without difficulty. A blunt grasper was placed through the lateralmost 5 mm port and used to grasp the dome of the gallbladder and elevate it anteriorly and superiorly. Another blunt grasper was placed through the other 5 mm port and used to retract the body and neck of the gallbladder. A dissector was placed through the epigastric port and using the electrocautery the peritoneal reflection at the gallbladder neck was opened. Blunt dissection was then carried out in this area until the gallbladder neck-cystic duct junction was readily identified and a good critical window was created. A single clip was placed on the gallbladder neck. A small  ductotomy was made just below the clip with laparoscopic scissors. A 14-gauge Angiocath was then placed through the anterior abdominal wall under direct vision. A Reddick cholangiogram catheter was then placed through the Angiocath and flushed.  Several attempts were made to place the catheter in the cystic duct but because of a valve the catheter would not feed.  I could see her anatomy very clearly.  At this point we elected to forego the cholangiogram since the liver functions were normal.  2 clips were placed  proximally on the cystic duct and the duct was divided between the 2 sets of clips.  Posterior to this the cystic artery was identified and again dissected bluntly in a circumferential manner until a good window  was created. 2 clips were placed proximally and one distally on the artery and the artery was divided  between the 2 sets of clips. Next a laparoscopic hook cautery device was used to separate the gallbladder from the liver bed. Prior to completely detaching the gallbladder from the liver bed the liver bed was inspected and several small bleeding points were coagulated with the electrocautery until the area was completely hemostatic. The gallbladder was then detached the rest of it from the liver bed without difficulty. A laparoscopic bag was inserted through the hassan port. The laparoscope was moved to the epigastric port. The gallbladder was placed within the bag and the bag was sealed.  The bag with the gallbladder was then removed with the [facility] cannula through the infraumbilical port without difficulty. The fascial defect was then closed with the previously placed Vicryl pursestring stitch as well as with another figure-of-eight 0 Vicryl stitch. The liver bed was inspected again and found to be hemostatic. The abdomen was irrigated with copious amounts of saline until the effluent was clear. The ports were then removed under direct vision without difficulty and were found to be hemostatic. The gas was allowed to escape. No other abnormalities were noted on general inspection of the abdomen. The skin incisions were all closed with interrupted 4-0 Monocryl subcuticular stitches. Dermabond dressings were applied. The patient tolerated the procedure well. At the end of the case all needle sponge and instrument counts were correct. The patient was then awakened and taken to recovery in stable condition  PLAN OF CARE: Discharge to home after PACU  PATIENT DISPOSITION:  PACU - hemodynamically stable.   Delay start of Pharmacological VTE agent (>24hrs) due to surgical blood loss or risk of bleeding: not applicable

## 2022-07-25 NOTE — Anesthesia Preprocedure Evaluation (Addendum)
Anesthesia Evaluation  Patient identified by MRN, date of birth, ID band Patient awake    Reviewed: Allergy & Precautions, NPO status , Patient's Chart, lab work & pertinent test results  History of Anesthesia Complications (+) history of anesthetic complications (slow to wake up after wisdom teeth)  Airway Mallampati: II  TM Distance: >3 FB Neck ROM: Full    Dental  (+) Dental Advisory Given, Teeth Intact   Pulmonary neg pulmonary ROS   Pulmonary exam normal breath sounds clear to auscultation       Cardiovascular (-) hypertension(-) angina (-) Past MI, (-) Cardiac Stents and (-) CABG + dysrhythmias (sinus tachycardia)  Rhythm:Regular Rate:Normal  TTE 02/01/2019: IMPRESSIONS     1. Left ventricular ejection fraction, by visual estimation, is 60 to  65%. The left ventricle has normal function. There is no left ventricular  hypertrophy. Normal diastolic function.   2. Global right ventricle has normal systolic function.The right  ventricular size is normal. No increase in right ventricular wall  thickness.   3. Left atrial size was normal.   4. Right atrial size was normal.   5. The mitral valve is normal in structure. No evidence of mitral valve  regurgitation.   6. The tricuspid valve is normal in structure. Tricuspid valve  regurgitation is trivial.   7. The aortic valve was not well visualized. Aortic valve regurgitation  is not visualized. No evidence of aortic valve sclerosis or stenosis.   8. The pulmonic valve was grossly normal. Pulmonic valve regurgitation is  trivial.   9. The inferior vena cava is normal in size with greater than 50%  respiratory variability, suggesting right atrial pressure of 3 mmHg.  10. TR signal is inadequate for assessing pulmonary artery systolic  pressure.     Neuro/Psych  Headaches, Seizures - (h/o febrile seizure at 27 years old),  PSYCHIATRIC DISORDERS Anxiety Depression        GI/Hepatic Neg liver ROS,neg GERD  ,,IBS   Endo/Other  negative endocrine ROS    Renal/GU negative Renal ROS     Musculoskeletal scoliosis   Abdominal   Peds  Hematology negative hematology ROS (+)   Anesthesia Other Findings   Reproductive/Obstetrics mirena                             Anesthesia Physical Anesthesia Plan  ASA: 2  Anesthesia Plan: General   Post-op Pain Management:    Induction: Intravenous  PONV Risk Score and Plan: 3 and Ondansetron, Dexamethasone and Treatment may vary due to age or medical condition  Airway Management Planned: Oral ETT  Additional Equipment: None  Intra-op Plan:   Post-operative Plan: Extubation in OR  Informed Consent: I have reviewed the patients History and Physical, chart, labs and discussed the procedure including the risks, benefits and alternatives for the proposed anesthesia with the patient or authorized representative who has indicated his/her understanding and acceptance.     Dental advisory given  Plan Discussed with: CRNA and Anesthesiologist  Anesthesia Plan Comments: (Risks of general anesthesia discussed including, but not limited to, sore throat, hoarse voice, chipped/damaged teeth, injury to vocal cords, nausea and vomiting, allergic reactions, lung infection, heart attack, stroke, and death. All questions answered. )        Anesthesia Quick Evaluation

## 2022-07-25 NOTE — Interval H&P Note (Signed)
History and Physical Interval Note:  07/25/2022 12:14 PM  Virginia Kane  has presented today for surgery, with the diagnosis of GALLSTONES.  The various methods of treatment have been discussed with the patient and family. After consideration of risks, benefits and other options for treatment, the patient has consented to  Procedure(s): LAPAROSCOPIC CHOLECYSTECTOMY WITH INTRAOPERATIVE CHOLANGIOGRAM (N/A) as a surgical intervention.  The patient's history has been reviewed, patient examined, no change in status, stable for surgery.  I have reviewed the patient's chart and labs.  Questions were answered to the patient's satisfaction.     Chevis Pretty III

## 2022-07-25 NOTE — Anesthesia Postprocedure Evaluation (Signed)
Anesthesia Post Note  Patient: Virginia Kane  Procedure(s) Performed: LAPAROSCOPIC CHOLECYSTECTOMY (Abdomen)     Patient location during evaluation: PACU Anesthesia Type: General Level of consciousness: awake and alert, patient cooperative and oriented Pain management: pain level controlled Vital Signs Assessment: post-procedure vital signs reviewed and stable Respiratory status: spontaneous breathing, nonlabored ventilation and respiratory function stable Cardiovascular status: blood pressure returned to baseline and stable Postop Assessment: no apparent nausea or vomiting Anesthetic complications: no   No notable events documented.  Last Vitals:  Vitals:   07/25/22 1430 07/25/22 1445  BP: (!) 106/59 (!) 96/51  Pulse: 67 67  Resp: 14 13  Temp:    SpO2: 96% 98%    Last Pain:  Vitals:   07/25/22 1415  PainSc: Asleep                 Breon Rehm,E. Nas Wafer

## 2022-07-25 NOTE — Transfer of Care (Signed)
Immediate Anesthesia Transfer of Care Note  Patient: Virginia Kane  Procedure(s) Performed: LAPAROSCOPIC CHOLECYSTECTOMY (Abdomen)  Patient Location: PACU  Anesthesia Type:General  Level of Consciousness: awake, alert , and oriented  Airway & Oxygen Therapy: Patient Spontanous Breathing  Post-op Assessment: Report given to RN and Post -op Vital signs reviewed and stable  Post vital signs: Reviewed and stable  Last Vitals:  Vitals Value Taken Time  BP 106/57 07/25/22 1415  Temp 36.6 C 07/25/22 1408  Pulse 76 07/25/22 1416  Resp 12 07/25/22 1416  SpO2 96 % 07/25/22 1416  Vitals shown include unvalidated device data.  Last Pain:  Vitals:   07/25/22 1216  PainSc: 3          Complications: No notable events documented.

## 2022-07-26 ENCOUNTER — Encounter (HOSPITAL_COMMUNITY): Payer: Self-pay | Admitting: General Surgery

## 2022-07-29 LAB — SURGICAL PATHOLOGY

## 2022-08-04 ENCOUNTER — Encounter: Payer: Self-pay | Admitting: Family Medicine

## 2022-08-05 ENCOUNTER — Ambulatory Visit: Payer: 59 | Admitting: Family Medicine

## 2022-08-05 ENCOUNTER — Encounter: Payer: Self-pay | Admitting: Family Medicine

## 2022-08-05 VITALS — BP 102/68 | HR 84 | Temp 97.8°F | Ht 65.0 in | Wt 134.0 lb

## 2022-08-05 DIAGNOSIS — J019 Acute sinusitis, unspecified: Secondary | ICD-10-CM | POA: Diagnosis not present

## 2022-08-05 MED ORDER — AMOXICILLIN-POT CLAVULANATE 875-125 MG PO TABS
1.0000 | ORAL_TABLET | Freq: Two times a day (BID) | ORAL | 0 refills | Status: DC
Start: 2022-08-05 — End: 2022-09-09

## 2022-08-05 NOTE — Progress Notes (Signed)
Subjective:  Virginia Kane is a 27 y.o. female who presents for an 11 day hx of URI symptoms.  Sinus pain, pressure, thick purulent nasal drainage, post nasal drainage and cough.   Denies fever, chills, dizziness, ear pain, chest pain, palpitations, shortness of breath, abdominal pain, N/V/D.    Treatment to date: Sudafed, steroid nasal spray, zinc, vitamin C  No other aggravating or relieving factors.  No other c/o.  ROS as in subjective.   Objective: Vitals:   08/05/22 1109  BP: 102/68  Pulse: 84  Temp: 97.8 F (36.6 C)  SpO2: 100%    General appearance: Alert, WD/WN, no distress, mildly ill appearing                             Skin: warm, no rash                           Head: + sinus tenderness                            Eyes: conjunctiva normal, corneas clear, PERRLA                            Ears: pearly TMs, external ear canals normal                          Nose: septum midline, turbinates swollen, with erythema             Mouth/throat: MMM, tongue normal, mild pharyngeal erythema                           Neck: supple, no adenopathy, no thyromegaly, nontender                          Heart: RRR                         Lungs: CTA bilaterally, no wheezes, rales, or rhonchi      Assessment: Acute sinusitis with symptoms > 10 days - Plan: amoxicillin-clavulanate (AUGMENTIN) 875-125 MG tablet   Plan: Augmentin prescribed.  Suggested symptomatic OTC remedies. Nasal saline spray for congestion.  Tylenol or Ibuprofen OTC prn.  Call/return if worsening or not back to baseline after completing the antibiotic.

## 2022-08-05 NOTE — Patient Instructions (Signed)
Take the antibiotic as prescribed with food.   Continue treating your symptoms.   Follow up if not back to baseline after completing the antibiotic.

## 2022-08-07 ENCOUNTER — Ambulatory Visit (INDEPENDENT_AMBULATORY_CARE_PROVIDER_SITE_OTHER): Payer: 59 | Admitting: Nurse Practitioner

## 2022-08-07 ENCOUNTER — Ambulatory Visit: Payer: 59 | Admitting: Family Medicine

## 2022-08-07 ENCOUNTER — Encounter: Payer: Self-pay | Admitting: Nurse Practitioner

## 2022-08-07 ENCOUNTER — Other Ambulatory Visit: Payer: 59

## 2022-08-07 VITALS — BP 100/64 | HR 105 | Ht 65.0 in | Wt 133.0 lb

## 2022-08-07 DIAGNOSIS — R6881 Early satiety: Secondary | ICD-10-CM

## 2022-08-07 DIAGNOSIS — R194 Change in bowel habit: Secondary | ICD-10-CM

## 2022-08-07 DIAGNOSIS — R634 Abnormal weight loss: Secondary | ICD-10-CM | POA: Diagnosis not present

## 2022-08-07 DIAGNOSIS — R140 Abdominal distension (gaseous): Secondary | ICD-10-CM

## 2022-08-07 MED ORDER — DICYCLOMINE HCL 10 MG PO CAPS: 10.0000 mg | ORAL_CAPSULE | Freq: Two times a day (BID) | ORAL | 2 refills | Status: DC

## 2022-08-07 NOTE — Progress Notes (Signed)
Assessment / Plan   Primary GI: New- Virginia Burn, MD  27 year old female with a chronic (nearly lifelong) history of intermittent abdominal pain and constipation .   She presents with some new GI symptoms over the last year including intermittent loose stool , bloating/early satiety, and unintentional weight loss.  Of note she underwent a laparoscopic cholecystectomy 2 weeks ago for cholelithiasis and RUQ pain which has since resolved.  Gallbladder pathology compatible with chronic cholecystitis. It is unclear whether some of her GI symptoms over the last year have been gallbladder related.   Plan:  -We could give it more time postcholecystectomy to see which if any   symptoms improve.  However, given the ongoing early satiety will proceed with an EGD to rule out any upper GI tract pathology. The risks and benefits of EGD with possible biopsies were discussed with the patient who agrees to proceed.  -We discussed and both agreed to hold off on a colonoscopy for now.  She will keep a food diary to try and correlate with bowel changes.  -We discussed a trial of fiber but given the bloating and early satiety will hold off for now -Obtain celiac serologies -Trial of dicyclomine twice daily as needed for abdominal pain -Follow-up with me in clinic after the upper endoscopy   History of Present Illness   Chief Complaint: Multiple GI issues:  Bowel changes, abdominal pain, early satiety, weight loss   27 y.o. yo female with a past medical history consisting of, but not necessarily limited to migraines, anxiety, depression, cholelithiasis status post cholecystitis two weeks ago. Patient is referred by PCP for evaluation of abdominal pain and bowel changes.   Virginia Kane gives a history of nearly lifelong GI issues.  She has chronic generalized abdominal pain and altered bowel habits.  More recently she developed RUQ pain.  She establish care with her PCP in March 2024.  She was referred to GI for  evaluation.  An ultrasound was obtained and showed cholelithiasis.  She has since undergone a laparoscopic cholecystectomy by Dr. Carolynne Edouard a couple of weeks ago.  The right upper quadrant pain has resolved.  She is here to be evaluated for her more chronic GI issues   About a year ago Virginia Kane changed from having chronic constipation to having intermittent loose bowel movements.  Stools sometimes float and sometimes contain mucus but she has not seen any blood.  Several months ago she developed early satiety which is led to a 10 pound weight loss.  She has no associated nausea nor vomiting.  She rarely takes ibuprofen, no other NSAIDs.  Though her RUQ pain had resolved she still has chronic intermittent generalized abdominal pain. The pain is not related to eating except that diary is known to make it worse. The pain is not positional / related to movement or stress.  Having a BM can help. TUMS sometimes helps too.      Latest Ref Rng & Units 07/21/2022   11:30 AM 05/30/2022    1:55 PM 03/13/2021    3:40 AM  CBC  WBC 4.0 - 10.5 K/uL 8.1  9.4  15.1   Hemoglobin 12.0 - 15.0 g/dL 28.4  13.2  44.0   Hematocrit 36.0 - 46.0 % 44.2  41.9  31.3   Platelets 150 - 400 K/uL 301  347.0  294     Lab Results  Component Value Date   LIPASE 42.0 05/30/2022      Latest Ref Rng &  Units 07/21/2022   11:30 AM 05/30/2022    1:55 PM 02/23/2020   11:32 AM  CMP  Glucose 70 - 99 mg/dL 96  91  70   BUN 6 - 20 mg/dL 10  11  7    Creatinine 0.44 - 1.00 mg/dL 1.61  0.96  0.45   Sodium 135 - 145 mmol/L 138  139  141   Potassium 3.5 - 5.1 mmol/L 4.0  4.0  4.2   Chloride 98 - 111 mmol/L 104  105  106   CO2 22 - 32 mmol/L 25  25  26    Calcium 8.9 - 10.3 mg/dL 9.7  9.7  9.1   Total Protein 6.0 - 8.3 g/dL  7.9  6.8   Total Bilirubin 0.2 - 1.2 mg/dL  0.3  0.6   Alkaline Phos 39 - 117 U/L  81  75   AST 0 - 37 U/L  13  20   ALT 0 - 35 U/L  15  25       US Abdomen Limited RUQ (LIVER/GB) CLINICAL DATA:  Right upper quadrant  pain worsening after eating.  EXAM: ULTRASOUND ABDOMEN LIMITED RIGHT UPPER QUADRANT  COMPARISON:  None Available.  FINDINGS: Gallbladder:  Gallstones are identified. There is a nonmobile gallstone in the gallbladder neck. The largest gallstone measures 2.2 cm. No wall thickening visualized. No sonographic Murphy sign noted by sonographer.  Common bile duct:  Diameter: 3 mm.  Liver:  No focal lesion identified. Within normal limits in parenchymal echogenicity. Portal vein is patent on color Doppler imaging with normal direction of blood flow towards the liver.  Other: None.  IMPRESSION: Cholelithiasis without sonographic evidence of acute cholecystitis.  Electronically Signed   By: Sherian Rein M.D.   On: 06/18/2022 08:52    Past Medical History:  Diagnosis Date   Allergy    Seasonal   Anxiety    Complication of anesthesia    Slow to wake up   Depression    Epistaxis    Febrile seizure (HCC)    27 y.o.    IBS (irritable bowel syndrome)    Migraines    Ovarian cyst    Scoliosis    Tachycardia    Past Surgical History:  Procedure Laterality Date   CHOLECYSTECTOMY N/A 07/25/2022   Procedure: LAPAROSCOPIC CHOLECYSTECTOMY;  Surgeon: Griselda Miner, MD;  Location: [facility] OR;  Service: General;  Laterality: N/A;   WISDOM TOOTH EXTRACTION     2017 [facility]    Family History  Problem Relation Age of Onset   Sjogren's syndrome Mother    Anxiety disorder Mother    Basal cell carcinoma Mother    ADD / ADHD Mother    Cancer Mother    Depression Mother    Miscarriages / [place] Mother    Autism Brother    Anxiety disorder Brother    Diabetes Maternal Grandmother    Arthritis Maternal Grandmother    Breast cancer Maternal Grandmother    Skin cancer Maternal Grandmother    Cancer Maternal Grandmother    Social History   Tobacco Use   Smoking status: Never   Smokeless tobacco: Never  Vaping Use   Vaping Use: Never used  Substance Use  Topics   Alcohol use: Yes    Alcohol/week: 1.0 standard drink of alcohol    Types: 1 Standard drinks or equivalent per week    Comment: Occasionally 1 drink per week   Drug use: Never   Current Outpatient  Medications  Medication Sig Dispense Refill   amoxicillin-clavulanate (AUGMENTIN) 875-125 MG tablet Take 1 tablet by mouth 2 (two) times daily. 20 tablet 0   aspirin-acetaminophen-caffeine (EXCEDRIN MIGRAINE) 250-250-65 MG tablet Take 1-2 tablets by mouth every 6 (six) hours as needed for migraine.     Erenumab-aooe (AIMOVIG) 140 MG/ML SOAJ Inject 140 mg into the skin every 28 (twenty-eight) days. 1.12 mL 11   levonorgestrel (MIRENA) 20 MCG/DAY IUD 1 each by Intrauterine route once.     Ubrogepant (UBRELVY) 100 MG TABS Take 1 tablet (100 mg total) by mouth as needed. May repeat after 2 hours.  Maximum 2 tablets in 24 hours. 16 tablet 11   No current facility-administered medications for this visit.   No Known Allergies   Review of Systems: Positive for allergy, sinus trouble, anxiety, back pain, fatigue, menstrual pain, itching, headaches.  All other systems reviewed and negative except where noted in HPI.   Wt Readings from Last 3 Encounters:  08/07/22 133 lb (60.3 kg)  08/05/22 134 lb (60.8 kg)  07/25/22 137 lb (62.1 kg)    Physical Exam:  BP 100/64   Pulse (!) 105   Ht 5\' 5"  (1.651 m)   Wt 133 lb (60.3 kg)   LMP 07/11/2022 (Exact Date)   BMI 22.13 kg/m  Constitutional:  Pleasant, generally well appearing female in no acute distress. Psychiatric:  Normal mood and affect. Behavior is normal. EENT: Pupils normal.  Conjunctivae are normal. No scleral icterus. Neck supple.  Cardiovascular: Normal rate, regular rhythm.  Pulmonary/chest: Effort normal and breath sounds normal. No wheezing, rales or rhonchi. Abdominal: Soft, nondistended, nontender. Bowel sounds active throughout. There are no masses palpable. No hepatomegaly. Neurological: Alert and oriented to person place  and time. Skin: Skin is warm and dry. No rashes noted.  Willette Cluster, NP  08/07/2022, 2:24 PM  Cc:  Referring Provider Avanell Shackleton, NP-C

## 2022-08-07 NOTE — Patient Instructions (Addendum)
Keep food diary.   Your provider has requested that you go to the basement level for lab work before leaving today. Press "B" on the elevator. The lab is located at the first door on the left as you exit the elevator.  We have sent the following medications to your pharmacy for you to pick up at your convenience: Dicyclomine   You have been scheduled for an endoscopy. Please follow written instructions given to you at your visit today. If you use inhalers (even only as needed), please bring them with you on the day of your procedure.  _______________________________________________________  If your blood pressure at your visit was 140/90 or greater, please contact your primary care physician to follow up on this.   Follow- up after EGD on 10/29/22 at 2:00 pm with Willette Cluster NP _______________________________________________________  If you are age 66 or older, your body mass index should be between 23-30. Your Body mass index is 22.13 kg/m. If this is out of the aforementioned range listed, please consider follow up with your Primary Care Provider.  If you are age 78 or younger, your body mass index should be between 19-25. Your Body mass index is 22.13 kg/m. If this is out of the aformentioned range listed, please consider follow up with your Primary Care Provider.   ________________________________________________________  The [facility] providers would like to encourage you to use [facility] to communicate with providers for non-urgent requests or questions.  Due to long hold times on the telephone, sending your provider a message by [facility] may be a faster and more efficient way to get a response.  Please allow 48 business hours for a response.  Please remember that this is for non-urgent requests.  _______________________________________________________  Thank you for choosing me and [facility].

## 2022-08-08 ENCOUNTER — Encounter: Payer: Self-pay | Admitting: Nurse Practitioner

## 2022-08-08 LAB — TISSUE TRANSGLUTAMINASE, IGA: (tTG) Ab, IgA: <1 U/mL

## 2022-08-08 LAB — IGA: Immunoglobulin A: 254 mg/dL (ref 47–310)

## 2022-08-09 NOTE — Progress Notes (Signed)
I agree with the assessment and plan as outlined by Ms. Guenther. 

## 2022-08-15 ENCOUNTER — Other Ambulatory Visit (HOSPITAL_COMMUNITY): Payer: Self-pay

## 2022-08-15 ENCOUNTER — Telehealth: Payer: Self-pay | Admitting: Pharmacy Technician

## 2022-08-15 NOTE — Telephone Encounter (Signed)
Patient Advocate Encounter  Received notification from [employer] that prior authorization for UBRELVY 100MG  is required.   PA submitted on 5.31.24 [facility] Status is pending

## 2022-09-09 ENCOUNTER — Ambulatory Visit (AMBULATORY_SURGERY_CENTER): Payer: 59 | Admitting: Internal Medicine

## 2022-09-09 ENCOUNTER — Encounter: Payer: Self-pay | Admitting: Internal Medicine

## 2022-09-09 VITALS — BP 95/58 | HR 91 | Temp 99.1°F | Resp 21 | Ht 65.0 in | Wt 133.0 lb

## 2022-09-09 DIAGNOSIS — R634 Abnormal weight loss: Secondary | ICD-10-CM

## 2022-09-09 DIAGNOSIS — K319 Disease of stomach and duodenum, unspecified: Secondary | ICD-10-CM

## 2022-09-09 DIAGNOSIS — K2980 Duodenitis without bleeding: Secondary | ICD-10-CM | POA: Diagnosis not present

## 2022-09-09 DIAGNOSIS — R6881 Early satiety: Secondary | ICD-10-CM

## 2022-09-09 DIAGNOSIS — R140 Abdominal distension (gaseous): Secondary | ICD-10-CM

## 2022-09-09 DIAGNOSIS — K317 Polyp of stomach and duodenum: Secondary | ICD-10-CM | POA: Diagnosis present

## 2022-09-09 MED ORDER — SODIUM CHLORIDE 0.9 % IV SOLN
500.0000 mL | Freq: Once | INTRAVENOUS | Status: DC
Start: 2022-09-09 — End: 2022-09-09

## 2022-09-09 MED ORDER — OMEPRAZOLE 40 MG PO CPDR
40.0000 mg | DELAYED_RELEASE_CAPSULE | Freq: Every day | ORAL | 2 refills | Status: DC
Start: 2022-09-09 — End: 2023-03-06

## 2022-09-09 NOTE — Op Note (Signed)
[facility] Patient Name: Virginia Kane Procedure Date: 09/09/2022 9:54 AM MRN: [id number] Endoscopist: Madelyn Brunner Dickens , , [id number] Age: 27 Referring MD:  Date of Birth: Dec 06, 1995 Gender: Female Account #: [id number] Procedure:                Upper GI endoscopy Indications:              Early satiety, Weight loss, Loose stools Medicines:                Monitored Anesthesia Care Procedure:                Pre-Anesthesia Assessment:                           - Prior to the procedure, a History and Physical                            was performed, and patient medications and                            allergies were reviewed. The patient's tolerance of                            previous anesthesia was also reviewed. The risks                            and benefits of the procedure and the sedation                            options and risks were discussed with the patient.                            All questions were answered, and informed consent                            was obtained. Prior Anticoagulants: The patient has                            taken no anticoagulant or antiplatelet agents. ASA                            Grade Assessment: II - A patient with mild systemic                            disease. After reviewing the risks and benefits,                            the patient was deemed in satisfactory condition to                            undergo the procedure.                           After obtaining informed consent, the endoscope was  passed under direct vision. Throughout the                            procedure, the patient's blood pressure, pulse, and                            oxygen saturations were monitored continuously. The                            Olympus Scope [id number] was introduced through the                            mouth, and advanced to the second part of duodenum.                            The  upper GI endoscopy was accomplished without                            difficulty. The patient tolerated the procedure                            well. Scope In: Scope Out: Findings:                 The examined esophagus was normal.                           Localized mildly erythematous mucosa without                            bleeding was found in the gastric antrum. Biopsies                            were taken with a cold forceps for histology.                           Two 4 to 7 mm sessile polyps with no bleeding and                            no stigmata of recent bleeding were found in the                            gastric body. These polyps were removed with a cold                            snare. Resection and retrieval were complete.                           The examined duodenum was normal. Biopsies for                            histology were taken with a cold forceps for  evaluation of celiac disease. Complications:            No immediate complications. Estimated Blood Loss:     Estimated blood loss was minimal. Impression:               - Normal esophagus.                           - Erythematous mucosa in the antrum. Biopsied.                           - Two gastric polyps. Resected and retrieved.                           - Normal examined duodenum. Biopsied. Recommendation:           - Discharge patient to home (with escort).                           - Await pathology results.                           - Trial of omperazole 40 mg QD.                           - Can consider colestipol in the future.                           - Follow up with Willette Cluster as scheduled.                           - The findings and recommendations were discussed                            with the patient. Dr Particia Lather "[place]" [place],  09/09/2022 10:16:40 AM

## 2022-09-09 NOTE — Progress Notes (Signed)
Pt's states no medical or surgical changes since previsit or office visit. 

## 2022-09-09 NOTE — Progress Notes (Signed)
Uneventful anesthetic. Report to pacu rn. Vss. Care resumed by rn. 

## 2022-09-09 NOTE — Patient Instructions (Signed)
Discharge instructions given. Biopsies taken. Prescription sent to pharmacy. Resume previous medications. YOU HAD AN ENDOSCOPIC PROCEDURE TODAY AT THE [facility]:   Refer to the procedure report that was given to you for any specific questions about what was found during the examination.  If the procedure report does not answer your questions, please call your gastroenterologist to clarify.  If you requested that your care partner not be given the details of your procedure findings, then the procedure report has been included in a sealed envelope for you to review at your convenience later.  YOU SHOULD EXPECT: Some feelings of bloating in the abdomen. Passage of more gas than usual.  Walking can help get rid of the air that was put into your GI tract during the procedure and reduce the bloating. If you had a lower endoscopy (such as a colonoscopy or flexible sigmoidoscopy) you may notice spotting of blood in your stool or on the toilet paper. If you underwent a bowel prep for your procedure, you may not have a normal bowel movement for a few days.  Please Note:  You might notice some irritation and congestion in your nose or some drainage.  This is from the oxygen used during your procedure.  There is no need for concern and it should clear up in a day or so.  SYMPTOMS TO REPORT IMMEDIATELY:   Following upper endoscopy (EGD)  Vomiting of blood or coffee ground material  New chest pain or pain under the shoulder blades  Painful or persistently difficult swallowing  New shortness of breath  Fever of 100F or higher  Black, tarry-looking stools  For urgent or emergent issues, a gastroenterologist can be reached at any hour by calling [phone]. Do not use MyChart messaging for urgent concerns.    DIET:  We do recommend a small meal at first, but then you may proceed to your regular diet.  Drink plenty of fluids but you should avoid alcoholic beverages for 24  hours.  ACTIVITY:  You should plan to take it easy for the rest of today and you should NOT DRIVE or use heavy machinery until tomorrow (because of the sedation medicines used during the test).    FOLLOW UP: Our staff will call the number listed on your records the next business day following your procedure.  We will call around 7:15- 8:00 am to check on you and address any questions or concerns that you may have regarding the information given to you following your procedure. If we do not reach you, we will leave a message.     If any biopsies were taken you will be contacted by phone or by letter within the next 1-3 weeks.  Please call us at [phone] if you have not heard about the biopsies in 3 weeks.    SIGNATURES/CONFIDENTIALITY: You and/or your care partner have signed paperwork which will be entered into your electronic medical record.  These signatures attest to the fact that that the information above on your After Visit Summary has been reviewed and is understood.  Full responsibility of the confidentiality of this discharge information lies with you and/or your care-partner.

## 2022-09-09 NOTE — Progress Notes (Signed)
Called to room to assist during endoscopic procedure.  Patient ID and intended procedure confirmed with present staff. Received instructions for my participation in the procedure from the performing physician.  

## 2022-09-09 NOTE — Progress Notes (Signed)
GASTROENTEROLOGY PROCEDURE H&P NOTE   Primary Care Physician: Avanell Shackleton, NP-C    Reason for Procedure:   Early satiety, loose stools, weight loss  Plan:    EGD  Patient is appropriate for endoscopic procedure(s) in the ambulatory ([facility]) setting.  The nature of the procedure, as well as the risks, benefits, and alternatives were carefully and thoroughly reviewed with the patient. Ample time for discussion and questions allowed. The patient understood, was satisfied, and agreed to proceed.     HPI: Virginia Kane is a 27 y.o. female who presents for EGD for evaluation of early satiety, weight loss, and loose stools .  Patient was most recently seen in the Gastroenterology Clinic on 08/07/22.  No interval change in medical history since that appointment. Please refer to that note for full details regarding GI history and clinical presentation.   Past Medical History:  Diagnosis Date   Allergy    Seasonal   Anxiety    Complication of anesthesia    Slow to wake up after wisdom teeth removal   Depression    Epistaxis    Febrile seizure (HCC)    27 y.o.    IBS (irritable bowel syndrome)    Migraines    Ovarian cyst    Scoliosis    Tachycardia     Past Surgical History:  Procedure Laterality Date   CHOLECYSTECTOMY N/A 07/25/2022   Procedure: LAPAROSCOPIC CHOLECYSTECTOMY;  Surgeon: Griselda Miner, MD;  Location: [facility] OR;  Service: General;  Laterality: N/A;   WISDOM TOOTH EXTRACTION     2017 [facility]     Prior to Admission medications   Medication Sig Start Date End Date Taking? Authorizing Provider  Erenumab-aooe (AIMOVIG) 140 MG/ML SOAJ Inject 140 mg into the skin every 28 (twenty-eight) days. 05/30/22  Yes Drema Dallas, DO  aspirin-acetaminophen-caffeine (EXCEDRIN MIGRAINE) [phone] MG tablet Take 1-2 tablets by mouth every 6 (six) hours as needed for migraine.    [provider]  dicyclomine (BENTYL) 10 MG capsule Take 1 capsule (10 mg  total) by mouth 2 (two) times daily. Patient not taking: Reported on 09/09/2022 08/07/22   Meredith Pel, NP  levonorgestrel (MIRENA) 20 MCG/DAY IUD 1 each by Intrauterine route once.    [provider]  Ubrogepant (UBRELVY) 100 MG TABS Take 1 tablet (100 mg total) by mouth as needed. May repeat after 2 hours.  Maximum 2 tablets in 24 hours. 07/22/22   Drema Dallas, DO    Current Outpatient Medications  Medication Sig Dispense Refill   Erenumab-aooe (AIMOVIG) 140 MG/ML SOAJ Inject 140 mg into the skin every 28 (twenty-eight) days. 1.12 mL 11   aspirin-acetaminophen-caffeine (EXCEDRIN MIGRAINE) 250-250-65 MG tablet Take 1-2 tablets by mouth every 6 (six) hours as needed for migraine.     dicyclomine (BENTYL) 10 MG capsule Take 1 capsule (10 mg total) by mouth 2 (two) times daily. (Patient not taking: Reported on 09/09/2022) 60 capsule 2   levonorgestrel (MIRENA) 20 MCG/DAY IUD 1 each by Intrauterine route once.     Ubrogepant (UBRELVY) 100 MG TABS Take 1 tablet (100 mg total) by mouth as needed. May repeat after 2 hours.  Maximum 2 tablets in 24 hours. 16 tablet 11   Current Facility-Administered Medications  Medication Dose Route Frequency Provider Last Rate Last Admin   0.9 %  sodium chloride infusion  500 mL Intravenous Once Imogene Burn, MD        Allergies as of 09/09/2022   (  No Known Allergies)    Family History  Problem Relation Age of Onset   Colon polyps Mother    Sjogren's syndrome Mother    Anxiety disorder Mother    Basal cell carcinoma Mother    ADD / ADHD Mother    Cancer Mother    Depression Mother    Miscarriages / [place] Mother    Autism Brother    Anxiety disorder Brother    Colon polyps Maternal Uncle    Colon polyps Maternal Grandmother    Diabetes Maternal Grandmother    Arthritis Maternal Grandmother    Breast cancer Maternal Grandmother    Skin cancer Maternal Grandmother    Cancer Maternal Grandmother    Colon cancer Neg Hx     Esophageal cancer Neg Hx    Rectal cancer Neg Hx    Stomach cancer Neg Hx     Social History   Socioeconomic History   Marital status: Married    Spouse name: Not on file   Number of children: 0   Years of education: Not on file   Highest education level: Master's degree (e.g., MA, MS, MEng, MEd, MSW, MBA)  Occupational History   Not on file  Tobacco Use   Smoking status: Never   Smokeless tobacco: Never  Vaping Use   Vaping Use: Never used  Substance and Sexual Activity   Alcohol use: Yes    Alcohol/week: 1.0 standard drink of alcohol    Types: 1 Standard drinks or equivalent per week    Comment: Occasionally 1 drink per month   Drug use: Never   Sexual activity: Yes    Birth control/protection: Condom, I.U.D.  Other Topics Concern   Not on file  Social History Narrative   Social History      Diet? regular      Do you drink/eat things with caffeine? yes      Marital status?           married                         What year were you married? 2020      Do you live in a house, apartment, assisted living, condo, trailer, etc.?  house      Is it one or more stories? one      How many persons live in your home? 2       Do you have any pets in your home? (please list) 2 dog 2 lizards      Highest level of education completed? 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       no                               Type & how often? Need to more often       Advanced Directives      Do you have a living will? no      Do you have a DNR form?               no                   If not, do you want to discuss one?no       Do you have signed POA/HPOA for forms? No  Functional Status      Do you have difficulty bathing or dressing yourself? no      Do you have difficulty preparing food or eating? no      Do you have difficulty managing your medications? No       Do you have difficulty managing your finances? No        Do you have difficulty affording your medications? No       Social Determinants of Corporate investment banker Strain: Not on file  Food Insecurity: Not on file  Transportation Needs: Not on file  Physical Activity: Not on file  Stress: Not on file  Social Connections: Not on file  Intimate Partner Violence: Not on file    Physical Exam: Vital signs in last 24 hours: BP 118/75   Pulse 90   Temp 99.1 F (37.3 C) (Temporal)   Ht 5\' 5"  (1.651 m)   Wt 133 lb (60.3 kg)   SpO2 99%   BMI 22.13 kg/m  GEN: NAD EYE: Sclerae anicteric ENT: MMM CV: Non-tachycardic Pulm: No increased WOB GI: Soft NEURO:  Alert & Oriented   Eulah Pont, MD  Gastroenterology   09/09/2022 9:52 AM

## 2022-09-10 ENCOUNTER — Telehealth: Payer: Self-pay | Admitting: *Deleted

## 2022-09-10 NOTE — Telephone Encounter (Signed)
  Follow up Call-     09/09/2022    9:35 AM  Call back number  Post procedure Call Back phone  # [phone]  Permission to leave phone message Yes     Patient questions:  Do you have a fever, pain , or abdominal swelling? No. Pain Score  0 *  Have you tolerated food without any problems? Yes.    Have you been able to return to your normal activities? Yes.    Do you have any questions about your discharge instructions: Diet   No. Medications  No. Follow up visit  No.  Do you have questions or concerns about your Care? No.  Actions: * If pain score is 4 or above: No action needed, pain <4.

## 2022-09-12 ENCOUNTER — Encounter: Payer: Self-pay | Admitting: Internal Medicine

## 2022-09-16 ENCOUNTER — Telehealth: Payer: Self-pay

## 2022-09-16 NOTE — Telephone Encounter (Signed)
Information shared with the patient through My Chart.

## 2022-09-16 NOTE — Telephone Encounter (Signed)
The patient has sent the following message. Gunnar Fusi is working in the hospital this week. Would you address her concerns in Paula's absence or would it be more appropriate to wait for Gunnar Fusi? Thanks  Katiria, Oriol  [facility] Clinical Pool Phone Number: [phone]   Aurther Loft. I saw the results from my endoscopy biopsies and have a few questions. My main question is what could be causing the inflammation in my small bowel? I was reading about chronic peptic duodenitis and Brunner's gland hyperplasia/foveolar metaplasia and saw it can be associated with H pylori (which was negative on my endoscopy samples) and Crohn's disease. Is Crohn's of concern given my other GI symptoms? Will I need any further imaging or a colonoscopy to see if the inflammation extends further into my colon? And should I begin taking the Omeprazole that Dr. [name] recommended I try before the biopsy results became available?  Of note, I had a three week span of chronic diarrhea after my last appointment with you and was tested for c Diff which was negative. My bowel movements are now back to baseline but still frequently have mucus/what appears to be white strings.  Please feel free to call me if easier to discuss by phone. I can be reached at [phone]. Thank you so much!

## 2022-09-25 NOTE — Telephone Encounter (Signed)
Pharmacy Patient Advocate Encounter  Received notification from [facility] that Prior Authorization for UBRELVY 100MG   has been APPROVED from 08/15/2022 to 11/15/2022.Marland Kitchen  PA #/Case ID/Reference #: [id number]

## 2022-10-14 LAB — HM PAP SMEAR: HPV, high-risk: NEGATIVE

## 2022-10-24 ENCOUNTER — Telehealth: Payer: Self-pay | Admitting: *Deleted

## 2022-10-24 NOTE — Telephone Encounter (Signed)
Called patient in reference to complaints of having stomach pains and diarrhea. Suggested to patient to take OTC Imodium or any OTC antidiarrheal medication that she would prefer. Also suggested to patient if she decides to take Pepto Bismol for abdominal pain and diarrhea, to expect the possibility of black stools. Patient states the Bentyl is not helping as much within the last 2 weeks, while taking 2 times/day. Informed the patient, Ms.Guenther,PA will be notified and if any changes are made, the nurse will notify the patient. Patient understood and agrees.

## 2022-10-28 ENCOUNTER — Other Ambulatory Visit (HOSPITAL_COMMUNITY): Payer: Self-pay

## 2022-10-28 ENCOUNTER — Telehealth: Payer: Self-pay

## 2022-10-29 ENCOUNTER — Ambulatory Visit (INDEPENDENT_AMBULATORY_CARE_PROVIDER_SITE_OTHER): Payer: 59 | Admitting: Nurse Practitioner

## 2022-10-29 ENCOUNTER — Other Ambulatory Visit (HOSPITAL_COMMUNITY): Payer: Self-pay

## 2022-10-29 ENCOUNTER — Encounter: Payer: Self-pay | Admitting: Nurse Practitioner

## 2022-10-29 VITALS — BP 92/68 | HR 100 | Ht 65.0 in | Wt 131.8 lb

## 2022-10-29 DIAGNOSIS — R109 Unspecified abdominal pain: Secondary | ICD-10-CM | POA: Diagnosis not present

## 2022-10-29 DIAGNOSIS — K2980 Duodenitis without bleeding: Secondary | ICD-10-CM

## 2022-10-29 DIAGNOSIS — R195 Other fecal abnormalities: Secondary | ICD-10-CM | POA: Diagnosis not present

## 2022-10-29 DIAGNOSIS — K529 Noninfective gastroenteritis and colitis, unspecified: Secondary | ICD-10-CM | POA: Diagnosis not present

## 2022-10-29 NOTE — Patient Instructions (Addendum)
_______________________________________________________  If your blood pressure at your visit was 140/90 or greater, please contact your primary care physician to follow up on this.  _______________________________________________________  If you are age 29 or older, your body mass index should be between 23-30. Your Body mass index is 21.93 kg/m. If this is out of the aforementioned range listed, please consider follow up with your Primary Care Provider.  If you are age 67 or younger, your body mass index should be between 19-25. Your Body mass index is 21.93 kg/m. If this is out of the aformentioned range listed, please consider follow up with your Primary Care Provider.   ________________________________________________________  The [facility] providers would like to encourage you to use [facility] to communicate with providers for non-urgent requests or questions.  Due to long hold times on the telephone, sending your provider a message by [facility] may be a faster and more efficient way to get a response.  Please allow 48 business hours for a response.  Please remember that this is for non-urgent requests.  _______________________________________________________  11-28-2022 Hold anti-diarrheal medications 5 days prior to the colonoscopy   Continue omeprazole for 30 days and then stop  You have been scheduled for a colonoscopy. Please follow written instructions given to you at your visit today.   Please pick up your prep supplies at the pharmacy within the next 1-3 days.  If you use inhalers (even only as needed), please bring them with you on the day of your procedure.  DO NOT TAKE 7 DAYS PRIOR TO TEST- Trulicity (dulaglutide) Ozempic, Wegovy (semaglutide) Mounjaro (tirzepatide) Bydureon Bcise (exanatide extended release)  DO NOT TAKE 1 DAY PRIOR TO YOUR TEST Rybelsus (semaglutide) Adlyxin (lixisenatide) Victoza (liraglutide) Byetta  (exanatide) ___________________________________________________________________________  It was a pleasure to see you today!  Thank you for trusting me with your gastrointestinal care!

## 2022-10-29 NOTE — Progress Notes (Signed)
Primary GI: Virginia Burn, MD   ASSESSMENT & PLAN   27 year old female with nearly a lifelong history of GI symptoms.  For many years she had abdominal pain and constipation but then 1.5 years ago developed intermittent diarrhea with cramps, bloating and early satiety.  Episodes of diarrhea became worse following her cholecystectomy .  Suspect she has IBS.  She may have an element of bile acid diarrhea .  Celiac serologies negative.  C-diff (PCP office). She is worried about Crohn's disease --Will proceed with a colonoscopy for further evaluation of worsening crampy , mucoid diarrhea. The risks and benefits of colonoscopy with possible polypectomy / biopsies were discussed and the patient agrees to proceed.  -- If colonoscopy is negative recommend trial of cholestyramine.  In the interim it is okay to continue bismuth but all antidiarrheals should be held 5 days prior to colonoscopy  Duodenitis -Complete 4 more weeks of Omeprazole then can discontinue  HPI   Brief GI history  May 2024  Established care for evaluation of nearly lifelong GI issues with some new developments She had a change in bowel habits from chronic constipation to intermittent diarrhea over the preceding year .  She was also having intermittent abdominal pain, bloating, early satiety and weight loss.  She had had a cholecystectomy a few weeks prior and diarrhea may have gotten a little worse since.  Subsequently had celiac serologies which were negative .  She underwent EGD was essentially unremarkable except for non-H. pylori related duodenitis.   09/16/22  Patient called with questions about whether she could have Crohn's disease and whether she should start the omeprazole recommended by Dr. [name] at time of EGD.  Also reported that she had had a 3-week span of diarrhea but PCP tested her for C. difficile and it was negative.  We advised her to start the omeprazole.   10/24/22 she contacted the office with complaints of  ongoing episodes of diarrhea and pain.  We recommended Imodium or Bismuth.  She was already taken Bentyl prescribed at an earlier date   INTERVAL HISTORY     Jashae continues to have  lower abdominal pain as well as frequent episodes of crampy diarrhea.  Basically she has two different types of lower abdominal pain.    First , she has frequent lower abdominal pain which is unrelated to bowel movements or eating.  This pain comes and goes without relationship to food or bowel movements and is generally relieved with Bentyl.    Separately, she continues to have ongoing episodes of lower abdominal cramps with urgent diarrhea.  These episodes seem to be occurring with more frequency since her cholecystectomy in May. However she is clear that the intermittent diarrhea started a year before the cholecystectomy .She has mucus in her stools.  She has not seen any blood in her stools. She cannot relate the diarrhea to anything in particular that she is eating or drinking other than coffee and dairy which she avoids. Two tablets of Pepto-Bismol 4 times a day has been helpful in controlling the diarrhea and cramps  Previous GI Endoscopies / Labs / Imaging   **May not include all endoscopic evaluations   EGD 09/09/22 - Normal esophagus. - Erythematous mucosa in the antrum. Biopsied. - Two gastric polyps. Resected and retrieved. - Normal examined duodenum. Biopsied.   Diagnosis 1. Surgical [P], duodenal bx - DUODENAL MUCOSA WITH BRUNNER'S GLAND HYPERPLASIA AND FOVEOLAR METAPLASIA CONSISTENT WITH CHRONIC PEPTIC DUODENITIS. 2. Surgical [P], gastric bx -  ANTRAL AND OXYNTIC MUCOSA WITH NO SIGNIFICANT PATHOLOGY. - NO HELICOBACTER PYLORI ORGANISMS IDENTIFIED ON H&E STAINED SLIDE. 3. Surgical [P], gastric polyp x2, polyp (2) - FUNDIC GLAND POLYPS.      Latest Ref Rng & Units 05/30/2022    1:55 PM 02/23/2020   11:32 AM 01/14/2019    8:13 AM  Hepatic Function  Total Protein 6.0 - 8.3 g/dL 7.9  6.8  6.8    Albumin 3.5 - 5.2 g/dL 4.4  4.0    AST 0 - 37 U/L 13  20  19    ALT 0 - 35 U/L 15  25  36   Alk Phosphatase 39 - 117 U/L 81  75    Total Bilirubin 0.2 - 1.2 mg/dL 0.3  0.6  0.5        Latest Ref Rng & Units 07/21/2022   11:30 AM 05/30/2022    1:55 PM 03/13/2021    3:40 AM  CBC  WBC 4.0 - 10.5 K/uL 8.1  9.4  15.1   Hemoglobin 12.0 - 15.0 g/dL 16.1  09.6  04.5   Hematocrit 36.0 - 46.0 % 44.2  41.9  31.3   Platelets 150 - 400 K/uL 301  347.0  294      Past Medical History:  Diagnosis Date   Allergy    Seasonal   Anxiety    Complication of anesthesia    Slow to wake up after wisdom teeth removal   Depression    Epistaxis    Febrile seizure (HCC)    27 y.o.    IBS (irritable bowel syndrome)    Migraines    Ovarian cyst    Scoliosis    Tachycardia     Past Surgical History:  Procedure Laterality Date   CHOLECYSTECTOMY N/A 07/25/2022   Procedure: LAPAROSCOPIC CHOLECYSTECTOMY;  Surgeon: Griselda Miner, MD;  Location: [facility] OR;  Service: General;  Laterality: N/A;   WISDOM TOOTH EXTRACTION     [facility]     Family History  Problem Relation Age of Onset   Colon polyps Mother    Sjogren's syndrome Mother    Anxiety disorder Mother    Basal cell carcinoma Mother    ADD / ADHD Mother    Cancer Mother    Depression Mother    Miscarriages / [place] Mother    Autism Brother    Anxiety disorder Brother    Colon polyps Maternal Uncle    Colon polyps Maternal Grandmother    Diabetes Maternal Grandmother    Arthritis Maternal Grandmother    Breast cancer Maternal Grandmother    Skin cancer Maternal Grandmother    Cancer Maternal Grandmother    Colon cancer Neg Hx    Esophageal cancer Neg Hx    Rectal cancer Neg Hx    Stomach cancer Neg Hx     Current Medications, Allergies, Family History and Social History were reviewed in [employer] electronic medical record.     Current Outpatient Medications  Medication Sig Dispense Refill    aspirin-acetaminophen-caffeine (EXCEDRIN MIGRAINE) 250-250-65 MG tablet Take 1-2 tablets by mouth every 6 (six) hours as needed for migraine.     bismuth subsalicylate (PEPTO BISMOL) 262 MG/15ML suspension Take 30 mLs by mouth every 6 (six) hours as needed.     dicyclomine (BENTYL) 10 MG capsule Take 1 capsule (10 mg total) by mouth 2 (two) times daily. 60 capsule 2   Erenumab-aooe (AIMOVIG) 140 MG/ML SOAJ Inject 140 mg into the skin every  28 (twenty-eight) days. 1.12 mL 11   levonorgestrel (MIRENA) 20 MCG/DAY IUD 1 each by Intrauterine route once.     omeprazole (PRILOSEC) 40 MG capsule Take 1 capsule (40 mg total) by mouth daily. 30 capsule 2   Ubrogepant (UBRELVY) 100 MG TABS Take 1 tablet (100 mg total) by mouth as needed. May repeat after 2 hours.  Maximum 2 tablets in 24 hours. 16 tablet 11   No current facility-administered medications for this visit.    Review of Systems: No chest pain. No shortness of breath. No urinary complaints.    Physical Exam  Wt Readings from Last 3 Encounters:  10/29/22 131 lb 12.8 oz (59.8 kg)  09/09/22 133 lb (60.3 kg)  08/07/22 133 lb (60.3 kg)    BP 92/68   Pulse 100   Ht 5\' 5"  (1.651 m)   Wt 131 lb 12.8 oz (59.8 kg)   BMI 21.93 kg/m  Constitutional:  Pleasant, generally well appearing female in no acute distress. Psychiatric: Normal mood and affect. Behavior is normal. EENT: Pupils normal.  Conjunctivae are normal. No scleral icterus. Neck supple.  Cardiovascular: Normal rate, regular rhythm.  Pulmonary/chest: Effort normal and breath sounds normal. No wheezing, rales or rhonchi. Abdominal: Soft, nondistended, nontender. Bowel sounds active throughout. There are no masses palpable. No hepatomegaly. Neurological: Alert and oriented to person place and time.    Willette Cluster, NP  10/29/2022, 2:12 PM

## 2022-10-30 ENCOUNTER — Other Ambulatory Visit (HOSPITAL_COMMUNITY): Payer: Self-pay

## 2022-10-30 NOTE — Telephone Encounter (Signed)
Pharmacy Patient Advocate Encounter   Received notification from CoverMyMeds that prior authorization for UBRELVY 100MG  is required/requested.   Insurance verification completed.   The patient is insured through [facility] .   Per test claim: PA required; PA submitted to [facility] via CoverMyMeds Key/confirmation #/EOC [id number] Status is pending

## 2022-10-31 ENCOUNTER — Other Ambulatory Visit (HOSPITAL_COMMUNITY): Payer: Self-pay

## 2022-10-31 NOTE — Telephone Encounter (Signed)
Pharmacy Patient Advocate Encounter  Received notification from [facility] that Prior Authorization for Ubrelvy 100MG  tablets has been APPROVED from 10-30-2022 to 10-30-2023. Ran test claim, Copay is $0.00 Quantity approved is 10 tablets per 30 days. This test claim was processed through [facility]- copay amounts may vary at other pharmacies due to pharmacy/plan contracts, or as the patient moves through the different stages of their insurance plan.   PA #/Case ID/Reference #: [id number]

## 2022-11-03 NOTE — Progress Notes (Signed)
I agree with the assessment and plan as outlined by Ms. Guenther. 

## 2022-11-20 ENCOUNTER — Encounter: Payer: Self-pay | Admitting: Internal Medicine

## 2022-12-03 ENCOUNTER — Encounter: Payer: Self-pay | Admitting: Internal Medicine

## 2022-12-03 ENCOUNTER — Ambulatory Visit: Payer: 59 | Admitting: Internal Medicine

## 2022-12-03 VITALS — BP 103/64 | HR 80 | Temp 98.0°F | Resp 16 | Ht 65.0 in | Wt 131.0 lb

## 2022-12-03 DIAGNOSIS — R197 Diarrhea, unspecified: Secondary | ICD-10-CM | POA: Diagnosis present

## 2022-12-03 DIAGNOSIS — K529 Noninfective gastroenteritis and colitis, unspecified: Secondary | ICD-10-CM

## 2022-12-03 IMAGING — US US BREAST*L* COMPLETE INC AXILLA
1 series · 14 of 18 positions shown · non-contrast
Comparison: None.

CLINICAL DATA: 24-year-old female with dense breasts bilaterally.
The patient is currently 5 weeks pregnant.

EXAM:
ULTRASOUND OF THE BILATERAL BREAST

[Series 1: us breast*left* complete inc axilla · 0.08mm/px · 14 of 18 slices shown]
[im 1/18]
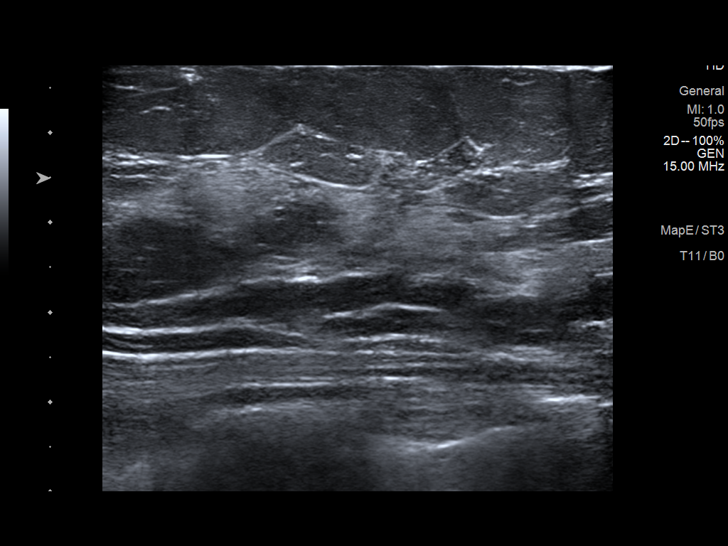
[im 2/18]
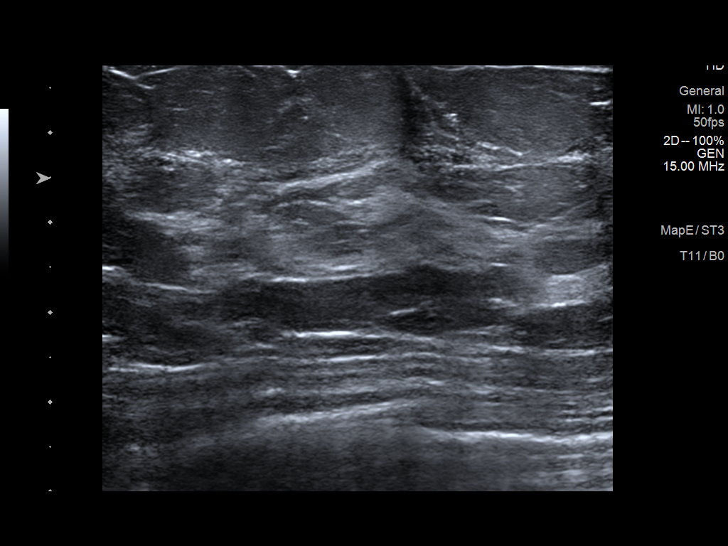
[im 4/18]
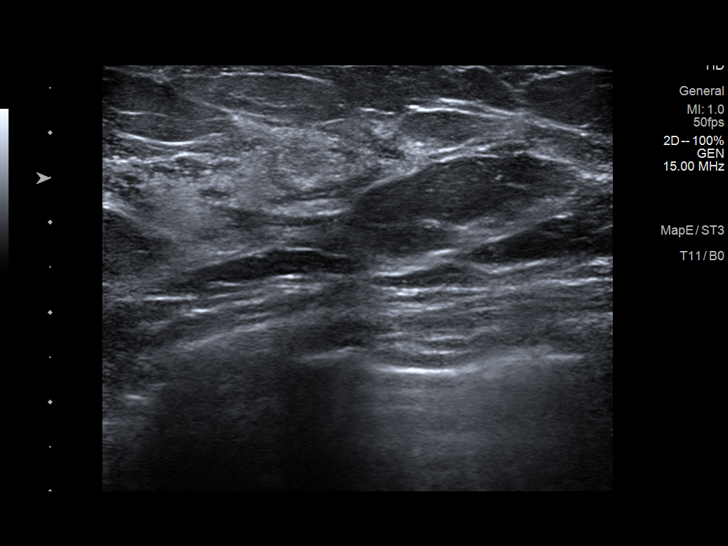
[im 5/18]
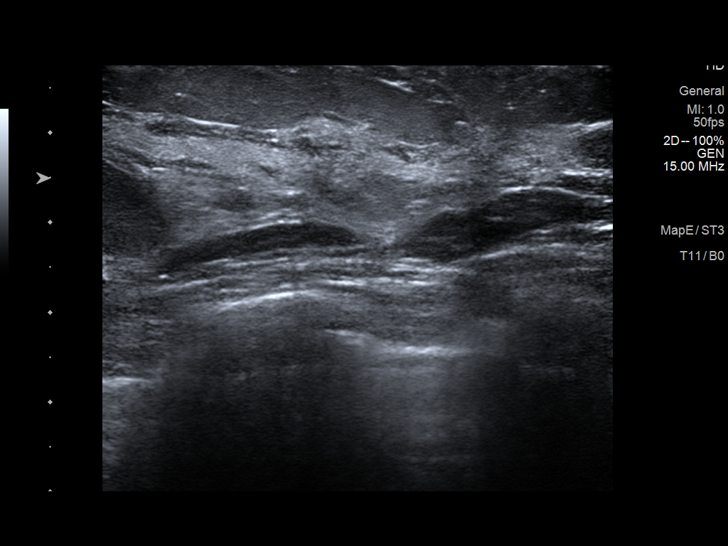
[im 6/18]
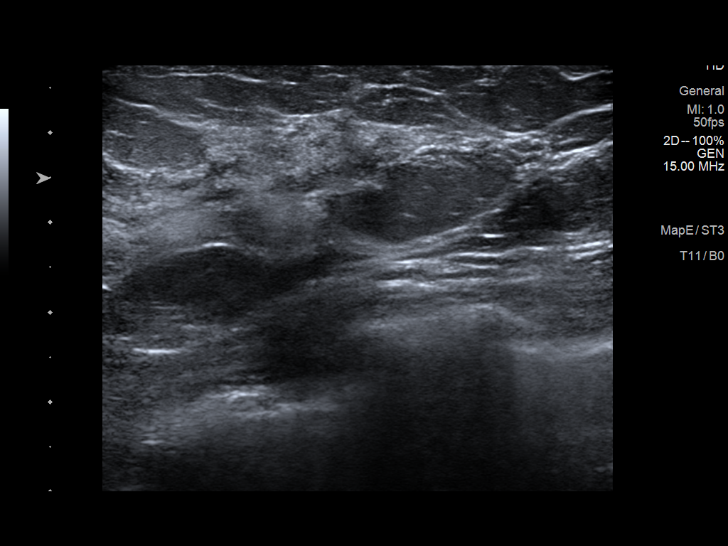
[im 8/18]
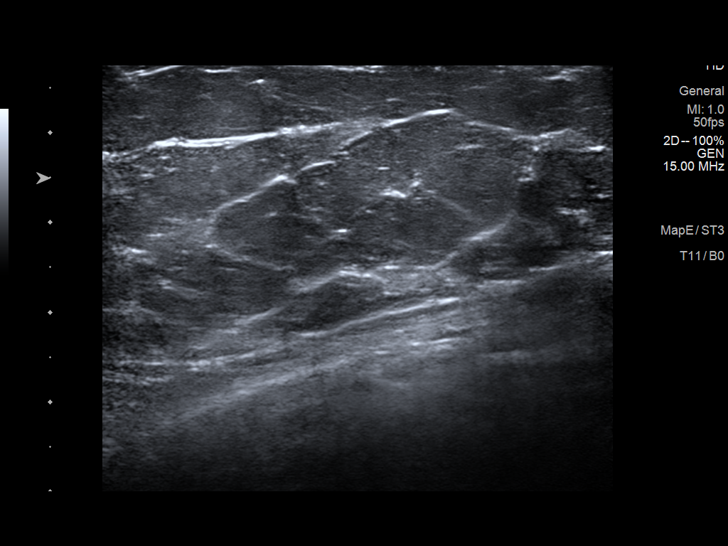
[im 9/18]
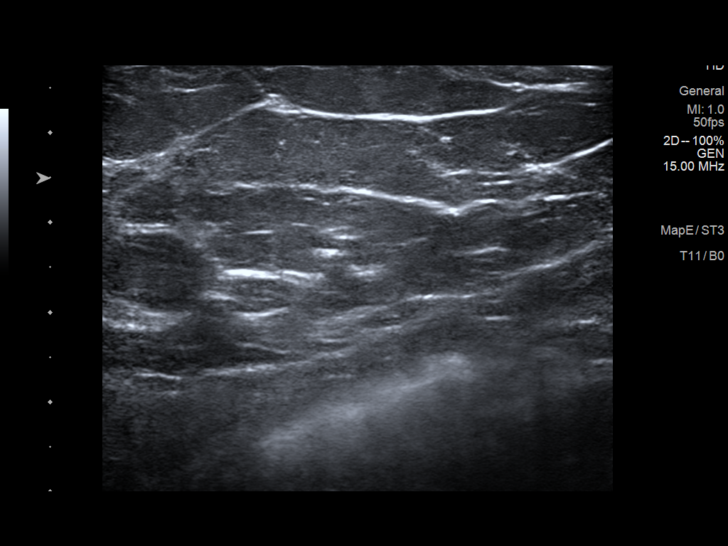
[im 10/18]
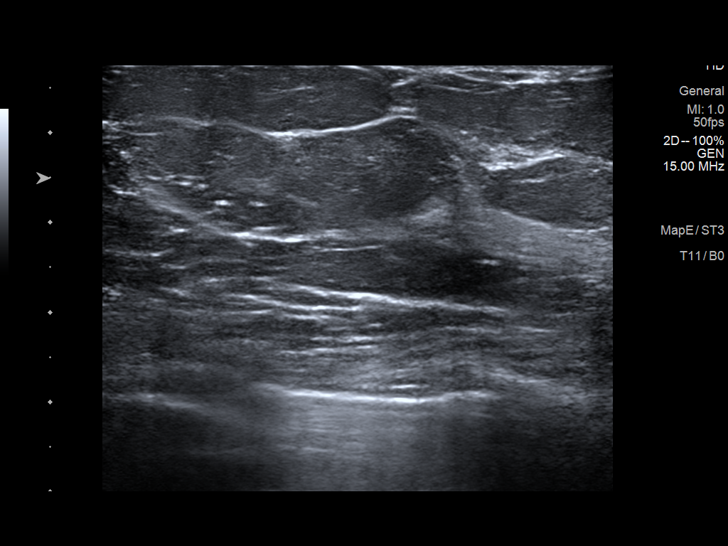
[im 11/18]
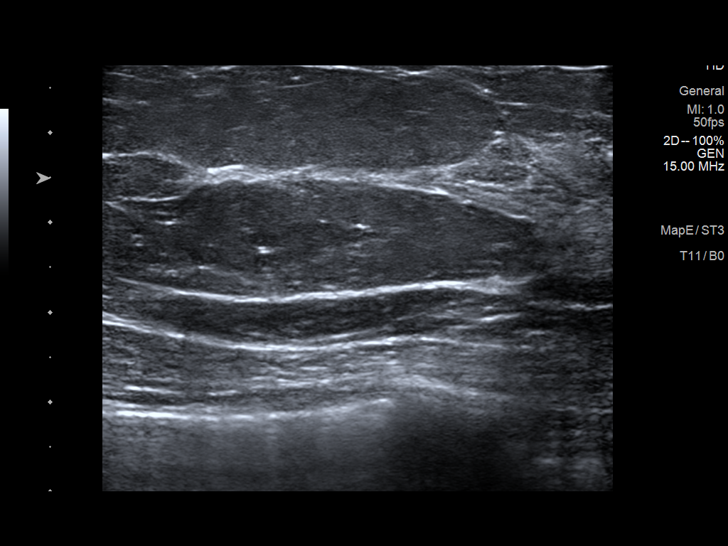
[im 13/18]
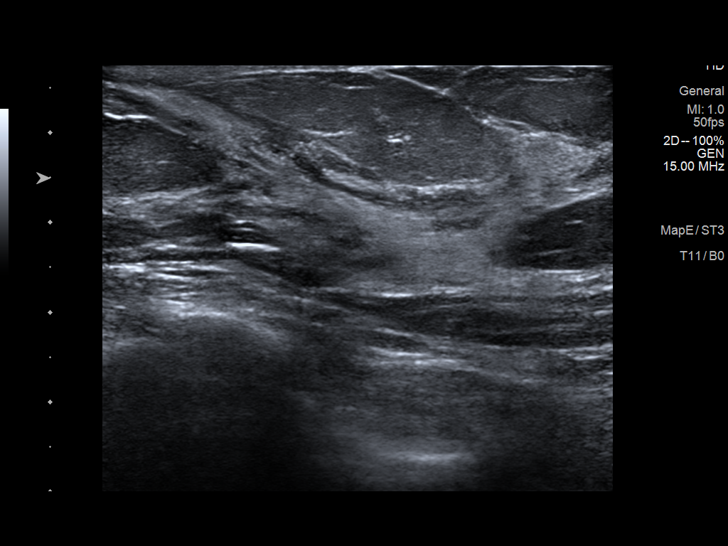
[im 14/18]
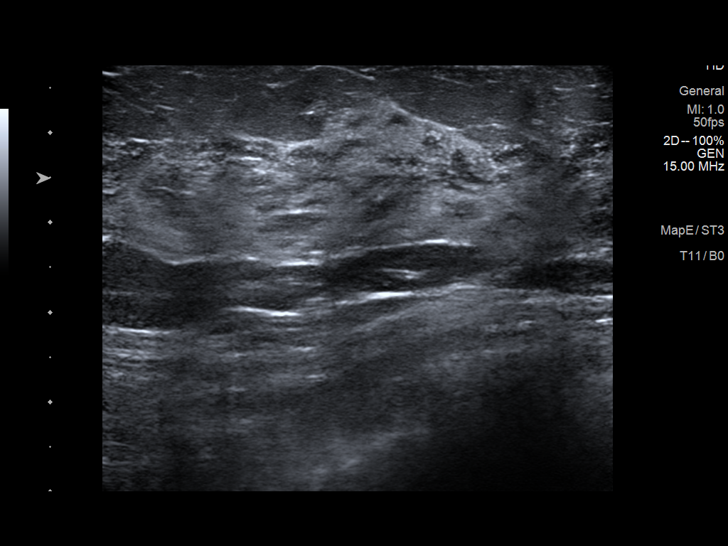
[im 15/18]
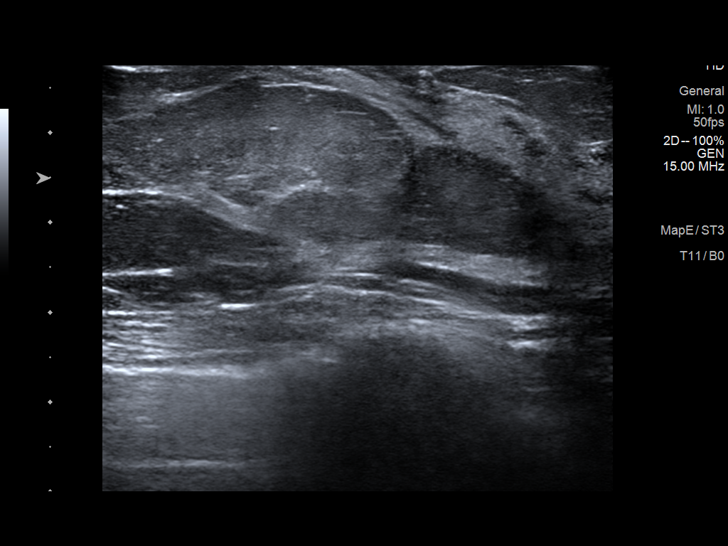
[im 17/18]
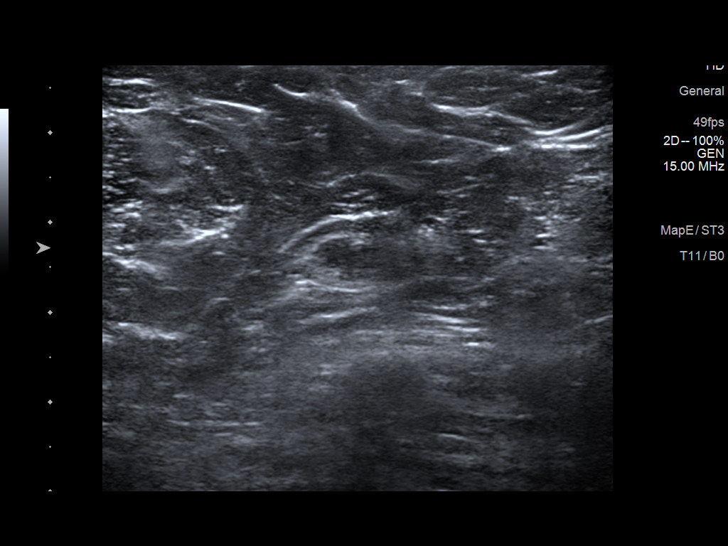
[im 18/18]
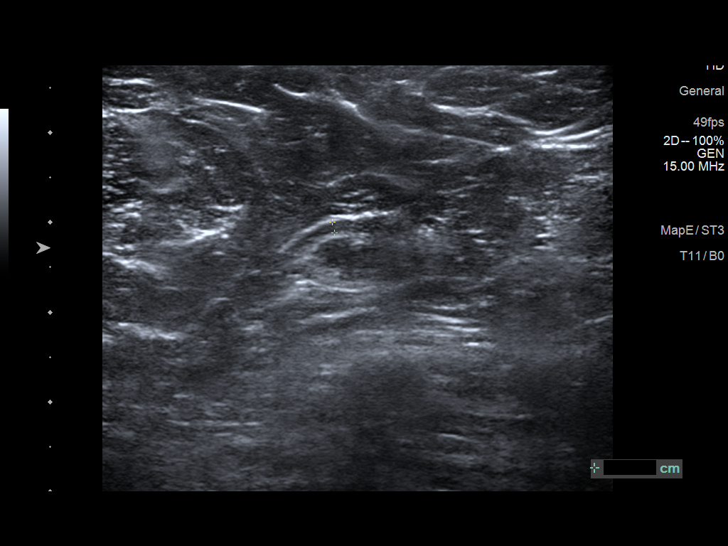

[14 of 18 positions shown; findings below may reference images not displayed]

FINDINGS: Targeted ultrasound is performed, showing normal fibroglandular and
axillary tissue without focal or suspicious findings of the
bilateral breasts.
IMPRESSION: No sonographic evidence of malignancy in the bilateral breasts.

RECOMMENDATION:
Screening mammogram at age 38 unless there are persistent or
intervening clinical concerns. (Code:GI-S-H0H)

I have discussed the findings and recommendations with the patient.
If applicable, a reminder letter will be sent to the patient
regarding the next appointment.

BI-RADS CATEGORY  1: Negative.

## 2022-12-03 MED ORDER — SODIUM CHLORIDE 0.9 % IV SOLN
500.0000 mL | Freq: Once | INTRAVENOUS | Status: DC
Start: 2022-12-03 — End: 2022-12-03

## 2022-12-03 MED ORDER — COLESTIPOL HCL 1 G PO TABS: 2.0000 g | ORAL_TABLET | Freq: Every day | ORAL | 1 refills | Status: DC

## 2022-12-03 NOTE — Patient Instructions (Signed)
Please see scheduled follow up appointment.  START taking Colestipol 2 g once daily.  Your medication has been sent to your pharmacy.  YOU HAD AN ENDOSCOPIC PROCEDURE TODAY AT THE [facility]:   Refer to the procedure report that was given to you for any specific questions about what was found during the examination.  If the procedure report does not answer your questions, please call your gastroenterologist to clarify.  If you requested that your care partner not be given the details of your procedure findings, then the procedure report has been included in a sealed envelope for you to review at your convenience later.  YOU SHOULD EXPECT: Some feelings of bloating in the abdomen. Passage of more gas than usual.  Walking can help get rid of the air that was put into your GI tract during the procedure and reduce the bloating. If you had a lower endoscopy (such as a colonoscopy or flexible sigmoidoscopy) you may notice spotting of blood in your stool or on the toilet paper. If you underwent a bowel prep for your procedure, you may not have a normal bowel movement for a few days.  Please Note:  You might notice some irritation and congestion in your nose or some drainage.  This is from the oxygen used during your procedure.  There is no need for concern and it should clear up in a day or so.  SYMPTOMS TO REPORT IMMEDIATELY:  Following lower endoscopy (colonoscopy or flexible sigmoidoscopy):  Excessive amounts of blood in the stool  Significant tenderness or worsening of abdominal pains  Swelling of the abdomen that is new, acute  Fever of 100F or higher  For urgent or emergent issues, a gastroenterologist can be reached at any hour by calling [phone]. Do not use MyChart messaging for urgent concerns.    DIET:  We do recommend a small meal at first, but then you may proceed to your regular diet.  Drink plenty of fluids but you should avoid alcoholic beverages for 24  hours.  ACTIVITY:  You should plan to take it easy for the rest of today and you should NOT DRIVE or use heavy machinery until tomorrow (because of the sedation medicines used during the test).    FOLLOW UP: Our staff will call the number listed on your records the next business day following your procedure.  We will call around 7:15- 8:00 am to check on you and address any questions or concerns that you may have regarding the information given to you following your procedure. If we do not reach you, we will leave a message.     If any biopsies were taken you will be contacted by phone or by letter within the next 1-3 weeks.  Please call us at [phone] if you have not heard about the biopsies in 3 weeks.    SIGNATURES/CONFIDENTIALITY: You and/or your care partner have signed paperwork which will be entered into your electronic medical record.  These signatures attest to the fact that that the information above on your After Visit Summary has been reviewed and is understood.  Full responsibility of the confidentiality of this discharge information lies with you and/or your care-partner.

## 2022-12-03 NOTE — Progress Notes (Signed)
Report to PACU, RN, vss, BBS= Clear.  

## 2022-12-03 NOTE — Progress Notes (Signed)
I have reviewed the patient's medical history in detail and updated the computerized patient record.

## 2022-12-03 NOTE — Op Note (Signed)
[facility] Patient Name: Virginia Kane Procedure Date: 12/03/2022 2:20 PM MRN: [id number] Endoscopist: Madelyn Brunner [place] , , [id number] Age: 27 Referring MD:  Date of Birth: Sep 07, 1995 Gender: Female Account #: [id number] Procedure:                Colonoscopy Indications:              Chronic diarrhea Medicines:                Monitored Anesthesia Care Procedure:                Pre-Anesthesia Assessment:                           - Prior to the procedure, a History and Physical                            was performed, and patient medications and                            allergies were reviewed. The patient's tolerance of                            previous anesthesia was also reviewed. The risks                            and benefits of the procedure and the sedation                            options and risks were discussed with the patient.                            All questions were answered, and informed consent                            was obtained. Prior Anticoagulants: The patient has                            taken no anticoagulant or antiplatelet agents. ASA                            Grade Assessment: II - A patient with mild systemic                            disease. After reviewing the risks and benefits,                            the patient was deemed in satisfactory condition to                            undergo the procedure.                           After obtaining informed consent, the colonoscope  was passed under direct vision. Throughout the                            procedure, the patient's blood pressure, pulse, and                            oxygen saturations were monitored continuously. The                            [id number]-HQ190L Colonoscope [id number] was introduced                            through the anus and advanced to the the terminal                            ileum. The colonoscopy was performed  without                            difficulty. The patient tolerated the procedure                            well. The quality of the bowel preparation was                            good. The terminal ileum, ileocecal valve,                            appendiceal orifice, and rectum were photographed. Scope In: 2:25:27 PM Scope Out: 2:41:07 PM Scope Withdrawal Time: 0 hours 11 minutes 45 seconds  Total Procedure Duration: 0 hours 15 minutes 40 seconds  Findings:                 The terminal ileum appeared normal.                           Non-bleeding internal hemorrhoids were found during                            retroflexion. The hemorrhoids were small.                           Biopsies for histology were taken with a cold                            forceps from the entire colon for evaluation of                            microscopic colitis. Complications:            No immediate complications. Estimated Blood Loss:     Estimated blood loss was minimal. Impression:               - The examined portion of the ileum was normal.                           -  Non-bleeding internal hemorrhoids.                           - Biopsies were taken with a cold forceps from the                            entire colon for evaluation of microscopic colitis. Recommendation:           - Discharge patient to home (with escort).                           - Await pathology results.                           - Start colestipol 2 g per day.                           - Return to GI clinic in 2-3 months.                           - The findings and recommendations were discussed                            with the patient. Dr Particia Lather "[name]" [name],  12/03/2022 2:45:55 PM

## 2022-12-03 NOTE — Progress Notes (Signed)
Called to room to assist during endoscopic procedure.  Patient ID and intended procedure confirmed with present staff. Received instructions for my participation in the procedure from the performing physician.  

## 2022-12-03 NOTE — Progress Notes (Signed)
GASTROENTEROLOGY PROCEDURE H&P NOTE   Primary Care Physician: Avanell Shackleton, NP-C    Reason for Procedure:   Diarrhea  Plan:    Colonoscopy  Patient is appropriate for endoscopic procedure(s) in the ambulatory ([facility]) setting.  The nature of the procedure, as well as the risks, benefits, and alternatives were carefully and thoroughly reviewed with the patient. Ample time for discussion and questions allowed. The patient understood, was satisfied, and agreed to proceed.     HPI: Virginia Kane is a 27 y.o. female who presents for colonoscopy for evaluation of diarrhea .  Patient was most recently seen in the Gastroenterology Clinic on 10/29/22.  No interval change in medical history since that appointment. Please refer to that note for full details regarding GI history and clinical presentation.   Past Medical History:  Diagnosis Date   Allergy    Seasonal   Anxiety    Complication of anesthesia    Slow to wake up after wisdom teeth removal   Depression    Epistaxis    Febrile seizure (HCC)    27 y.o.    IBS (irritable bowel syndrome)    Migraines    Ovarian cyst    Scoliosis    Tachycardia     Past Surgical History:  Procedure Laterality Date   CHOLECYSTECTOMY N/A 07/25/2022   Procedure: LAPAROSCOPIC CHOLECYSTECTOMY;  Surgeon: Griselda Miner, MD;  Location: [facility] OR;  Service: General;  Laterality: N/A;   WISDOM TOOTH EXTRACTION     2017 [facility]     Prior to Admission medications   Medication Sig Start Date End Date Taking? Authorizing Provider  bismuth subsalicylate (PEPTO BISMOL) 262 MG/15ML suspension Take 30 mLs by mouth every 6 (six) hours as needed.   Yes [provider]  aspirin-acetaminophen-caffeine (EXCEDRIN MIGRAINE) [phone] MG tablet Take 1-2 tablets by mouth every 6 (six) hours as needed for migraine.    [provider]  dicyclomine (BENTYL) 10 MG capsule Take 1 capsule (10 mg total) by mouth 2 (two) times daily.  08/07/22   Meredith Pel, NP  Erenumab-aooe (AIMOVIG) 140 MG/ML SOAJ Inject 140 mg into the skin every 28 (twenty-eight) days. 05/30/22   Drema Dallas, DO  levonorgestrel (MIRENA) 20 MCG/DAY IUD 1 each by Intrauterine route once.    [provider]  omeprazole (PRILOSEC) 40 MG capsule Take 1 capsule (40 mg total) by mouth daily. 09/09/22   Imogene Burn, MD  Ubrogepant (UBRELVY) 100 MG TABS Take 1 tablet (100 mg total) by mouth as needed. May repeat after 2 hours.  Maximum 2 tablets in 24 hours. 07/22/22   Drema Dallas, DO    Current Outpatient Medications  Medication Sig Dispense Refill   bismuth subsalicylate (PEPTO BISMOL) 262 MG/15ML suspension Take 30 mLs by mouth every 6 (six) hours as needed.     aspirin-acetaminophen-caffeine (EXCEDRIN MIGRAINE) 250-250-65 MG tablet Take 1-2 tablets by mouth every 6 (six) hours as needed for migraine.     dicyclomine (BENTYL) 10 MG capsule Take 1 capsule (10 mg total) by mouth 2 (two) times daily. 60 capsule 2   Erenumab-aooe (AIMOVIG) 140 MG/ML SOAJ Inject 140 mg into the skin every 28 (twenty-eight) days. 1.12 mL 11   levonorgestrel (MIRENA) 20 MCG/DAY IUD 1 each by Intrauterine route once.     omeprazole (PRILOSEC) 40 MG capsule Take 1 capsule (40 mg total) by mouth daily. 30 capsule 2   Ubrogepant (UBRELVY) 100 MG TABS Take 1 tablet (100  mg total) by mouth as needed. May repeat after 2 hours.  Maximum 2 tablets in 24 hours. 16 tablet 11   Current Facility-Administered Medications  Medication Dose Route Frequency Provider Last Rate Last Admin   0.9 %  sodium chloride infusion  500 mL Intravenous Once Imogene Burn, MD        Allergies as of 12/03/2022   (No Known Allergies)    Family History  Problem Relation Age of Onset   Colon polyps Mother    Sjogren's syndrome Mother    Anxiety disorder Mother    Basal cell carcinoma Mother    ADD / ADHD Mother    Cancer Mother    Depression Mother    Miscarriages / [place] Mother     Autism Brother    Anxiety disorder Brother    Colon polyps Maternal Uncle    Colon polyps Maternal Grandmother    Diabetes Maternal Grandmother    Arthritis Maternal Grandmother    Breast cancer Maternal Grandmother    Skin cancer Maternal Grandmother    Cancer Maternal Grandmother    Colon cancer Neg Hx    Esophageal cancer Neg Hx    Rectal cancer Neg Hx    Stomach cancer Neg Hx     Social History   Socioeconomic History   Marital status: Married    Spouse name: Not on file   Number of children: 0   Years of education: Not on file   Highest education level: Master's degree (e.g., MA, MS, MEng, MEd, MSW, MBA)  Occupational History   Not on file  Tobacco Use   Smoking status: Never   Smokeless tobacco: Never  Vaping Use   Vaping status: Never Used  Substance and Sexual Activity   Alcohol use: Yes    Alcohol/week: 1.0 standard drink of alcohol    Types: 1 Standard drinks or equivalent per week    Comment: Occasionally 1 drink per month   Drug use: Never   Sexual activity: Yes    Birth control/protection: Condom, I.U.D.  Other Topics Concern   Not on file  Social History Narrative   Social History      Diet? regular      Do you drink/eat things with caffeine? yes      Marital status?           married                         What year were you married? 2020      Do you live in a house, apartment, assisted living, condo, trailer, etc.?  house      Is it one or more stories? one      How many persons live in your home? 2       Do you have any pets in your home? (please list) 2 dog 2 lizards      Highest level of education completed? 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       no                               Type & how often? Need to more often       Advanced Directives      Do you have a living will? no  Do you have a DNR form?               no                   If not, do you want to discuss  one?no       Do you have signed POA/HPOA for forms? No       Functional Status      Do you have difficulty bathing or dressing yourself? no      Do you have difficulty preparing food or eating? no      Do you have difficulty managing your medications? No       Do you have difficulty managing your finances? No       Do you have difficulty affording your medications? No       Social Determinants of Corporate investment banker Strain: Not on file  Food Insecurity: Not on file  Transportation Needs: Not on file  Physical Activity: Not on file  Stress: Not on file  Social Connections: Not on file  Intimate Partner Violence: Not on file    Physical Exam: Vital signs in last 24 hours: BP 110/87   Pulse 100   Temp 98 F (36.7 C)   Ht 5\' 5"  (1.651 m)   Wt 131 lb (59.4 kg)   SpO2 100%   BMI 21.80 kg/m  GEN: NAD EYE: Sclerae anicteric ENT: MMM CV: Non-tachycardic Pulm: No increased WOB GI: Soft NEURO:  Alert & Oriented   Eulah Pont, MD [facility]   12/03/2022 2:14 PM

## 2022-12-04 ENCOUNTER — Telehealth: Payer: Self-pay

## 2022-12-04 NOTE — Telephone Encounter (Signed)
Follow up call to pt, no answer. 

## 2022-12-04 NOTE — Progress Notes (Signed)
Virtual Visit via Video Note  Consent was obtained for video visit:  Yes.   Answered questions that patient had about telehealth interaction:  Yes.   I discussed the limitations, risks, security and privacy concerns of performing an evaluation and management service by telemedicine. I also discussed with the patient that there may be a patient responsible charge related to this service. The patient expressed understanding and agreed to proceed.  Pt location: Home Physician Location: office Name of referring provider:  Avanell Shackleton, NP-C I connected with Virginia Kane at patients initiation/request on 12/05/2022 at 10:10 AM EDT by video enabled telemedicine application and verified that I am speaking with the correct person using two identifiers. Pt MRN:  [id number] Pt DOB:  1995/11/17 Video Participants:  Virginia Kane   Assessment/Plan:   1.  Migraine without aura, without status migrainosus, not intractable 2.  Hemiplegic migraine, without status migrainosus, not intractable    Migraine prevention:  Aimovig 140mg  Migraine rescue:  Ubrelvy 100mg .  Due to hemiplegic migraines, cannot take triptans.  Previously effective.   Limit use of pain relievers to no more than 2 days out of week to prevent risk of rebound or medication-overuse headache. Keep headache diary Follow up 6 months.    Subjective:  Virginia Kane is a 27 year old female with sinus tachycardia, depression and anxiety who follows up for hemiplegic migraine.   UPDATE: She has been on Aimovig since May. She is doing well.  Starts having increase in mild headaches the last few days for next dose.  She has been injecting in her abdomen and hasn't had any more vasovagal response. Intensity:  mild Duration:  less than 1 hour with Excedrin (has not needed the [place]) Frequency:  1 the last few days prior to next dose of Aimovig   Current NSAIDS:  none Current analgesics:  Excedrin Current triptans:  none Current  ergotamine:  none Current anti-emetic:  none Current muscle relaxants:  none Current anti-anxiolytic:  none Current sleep aide:  none Current Antihypertensive medications:  None Current Antidepressant medications:  none Current Anticonvulsant medications:  None Current anti-CGRP:  Aimovig 140mg , Ubrelvy 100mg  Current Vitamins/Herbal/Supplements:  none Current Antihistamines/Decongestants:  none Other therapy:  none Hormone/birth control:  Mirena     Diet:  Drinks 2 big water bottles daily.  Now mostly vegan diet.       HISTORY:  Onset:  Migraines since childhood.  Relatively well-controlled Location:  Holocephalic but primarily bitemporal Quality:  Pressure and throbbing Initial intensity:  If delays treatment, then 8/10; if treats early, then 6/10; denies new headache, thunderclap headache or severe headache that wakes  from sleep. Aura:  none Premonitory Phase:  none Postdrome:  Usually no Associated symptoms:  Nausea, vomiting, blurred vision, photophobia, phonophobia, osmophobia.  Rarely, vomiting or pain is so severe that she has passed out. She has had 3 episodes of left hemiplegic migraine (arm and leg) associated with left sided numbness (including face) with some slurred speech, that resolves by the next day.  Initial duration:  At least 8 hour Initial frequency:  4 to 5 times a month Initial frequency of abortive medication: 4 to 5 days a month Triggers:  Emotional stress, alcohol (certain beer and wine) Relieving factors:  Applying pressure to temples, sometimes sleep, early treatment with Excedrin. Activity:  aggravates     Past NSAIDS:  Advil, naproxen Past analgesics:  Tylenol Past abortive triptans:  Sumatriptan tablet Past abortive ergotamine:  none Past muscle relaxants:  none  Past anti-emetic:  none Past antihypertensive medications:  metoprolol Past antidepressant medications:  amitriptyline 100mg  QHS (effective) Past anticonvulsant medications:   none Past anti-CGRP:  Ubrelvy 100mg  (effective) Past vitamins/Herbal/Supplements:  magnesium Past antihistamines/decongestants:  none Other past therapies:  none     Family history of headache:  Mother (migraines) Other history:  Sinus tachycardia since age 9.  Has been evaluated by cardiology.  On metoprolol and [place].  Echocardiogram scheduled.   Past Medical History: Past Medical History:  Diagnosis Date   Allergy    Seasonal   Anxiety    Complication of anesthesia    Slow to wake up after wisdom teeth removal   Depression    Epistaxis    Febrile seizure (HCC)    27 y.o.    IBS (irritable bowel syndrome)    Migraines    Ovarian cyst    Scoliosis    Tachycardia     Medications: Outpatient Encounter Medications as of 12/05/2022  Medication Sig Note   aspirin-acetaminophen-caffeine (EXCEDRIN MIGRAINE) 250-250-65 MG tablet Take 1-2 tablets by mouth every 6 (six) hours as needed for migraine.    bismuth subsalicylate (PEPTO BISMOL) 262 MG/15ML suspension Take 30 mLs by mouth every 6 (six) hours as needed.    colestipol (COLESTID) 1 g tablet Take 2 tablets (2 g total) by mouth daily.    dicyclomine (BENTYL) 10 MG capsule Take 1 capsule (10 mg total) by mouth 2 (two) times daily.    Erenumab-aooe (AIMOVIG) 140 MG/ML SOAJ Inject 140 mg into the skin every 28 (twenty-eight) days. 07/21/2022: Started on 07/20/2022   levonorgestrel (MIRENA) 20 MCG/DAY IUD 1 each by Intrauterine route once.    omeprazole (PRILOSEC) 40 MG capsule Take 1 capsule (40 mg total) by mouth daily.    Ubrogepant (UBRELVY) 100 MG TABS Take 1 tablet (100 mg total) by mouth as needed. May repeat after 2 hours.  Maximum 2 tablets in 24 hours. 08/05/2022: As needed   No facility-administered encounter medications on file as of 12/05/2022.    Allergies: No Known Allergies  Family History: Family History  Problem Relation Age of Onset   Colon polyps Mother    Sjogren's syndrome Mother    Anxiety disorder  Mother    Basal cell carcinoma Mother    ADD / ADHD Mother    Cancer Mother    Depression Mother    Miscarriages / [place] Mother    Autism Brother    Anxiety disorder Brother    Colon polyps Maternal Uncle    Colon polyps Maternal Grandmother    Diabetes Maternal Grandmother    Arthritis Maternal Grandmother    Breast cancer Maternal Grandmother    Skin cancer Maternal Grandmother    Cancer Maternal Grandmother    Colon cancer Neg Hx    Esophageal cancer Neg Hx    Rectal cancer Neg Hx    Stomach cancer Neg Hx     Observations/Objective:   No acute distress.  Alert and oriented.  Speech fluent and not dysarthric.  Language intact.     Follow Up Instructions:    -I discussed the assessment and treatment plan with the patient. The patient was provided an opportunity to ask questions and all were answered. The patient agreed with the plan and demonstrated an understanding of the instructions.   The patient was advised to call back or seek an in-person evaluation if the symptoms worsen or if the condition fails to improve as anticipated.   Cira Servant, DO

## 2022-12-04 NOTE — Telephone Encounter (Signed)
Follow up call to pt, lm for pt to call if having any difficulty with normal activities or eating and drinking.  Also to call if any other questions or concerns.

## 2022-12-05 ENCOUNTER — Encounter: Payer: Self-pay | Admitting: Neurology

## 2022-12-05 ENCOUNTER — Telehealth (INDEPENDENT_AMBULATORY_CARE_PROVIDER_SITE_OTHER): Payer: 59 | Admitting: Neurology

## 2022-12-05 DIAGNOSIS — G43409 Hemiplegic migraine, not intractable, without status migrainosus: Secondary | ICD-10-CM

## 2022-12-05 DIAGNOSIS — G43009 Migraine without aura, not intractable, without status migrainosus: Secondary | ICD-10-CM

## 2022-12-11 LAB — SURGICAL PATHOLOGY

## 2022-12-12 ENCOUNTER — Encounter: Payer: Self-pay | Admitting: Internal Medicine

## 2022-12-23 ENCOUNTER — Encounter: Payer: Self-pay | Admitting: Neurology

## 2022-12-24 ENCOUNTER — Telehealth: Payer: Self-pay

## 2022-12-24 NOTE — Telephone Encounter (Signed)
PA needed for Aimovig 140 mg 

## 2023-01-15 ENCOUNTER — Telehealth: Payer: Self-pay

## 2023-01-15 ENCOUNTER — Other Ambulatory Visit (HOSPITAL_COMMUNITY): Payer: Self-pay

## 2023-01-15 NOTE — Telephone Encounter (Signed)
*  [facility]  Pharmacy Patient Advocate Encounter   Received notification from Pt Calls Messages that prior authorization for Aimovig 140MG /ML auto-injectors  is required/requested.   Insurance verification completed.   The patient is insured through [facility] .   Per test claim: PA required; PA submitted to above mentioned insurance via CoverMyMeds Key/confirmation #/EOC [id number] Status is pending

## 2023-01-15 NOTE — Telephone Encounter (Signed)
PA request has been Submitted. New Encounter created for follow up. For additional info see Pharmacy Prior Auth telephone encounter from 10/31.

## 2023-01-16 ENCOUNTER — Other Ambulatory Visit (HOSPITAL_COMMUNITY): Payer: Self-pay

## 2023-01-16 NOTE — Telephone Encounter (Signed)
Pharmacy Patient Advocate Encounter  Received notification from [facility] that Prior Authorization for Aimovig has been APPROVED from 01/15/2023 to 01/15/2024. Ran test claim, Copay is $0.00. This test claim was processed through [facility]- copay amounts may vary at other pharmacies due to pharmacy/plan contracts, or as the patient moves through the different stages of their insurance plan.

## 2023-01-19 ENCOUNTER — Other Ambulatory Visit: Payer: Self-pay | Admitting: Nurse Practitioner

## 2023-01-22 ENCOUNTER — Ambulatory Visit: Payer: 59 | Admitting: Neurology

## 2023-01-31 ENCOUNTER — Other Ambulatory Visit: Payer: Self-pay | Admitting: Internal Medicine

## 2023-03-06 ENCOUNTER — Other Ambulatory Visit: Payer: Self-pay | Admitting: Internal Medicine

## 2023-03-06 DIAGNOSIS — K319 Disease of stomach and duodenum, unspecified: Secondary | ICD-10-CM

## 2023-03-09 ENCOUNTER — Telehealth: Payer: 59 | Admitting: Physician Assistant

## 2023-03-09 DIAGNOSIS — H01001 Unspecified blepharitis right upper eyelid: Secondary | ICD-10-CM | POA: Diagnosis not present

## 2023-03-09 MED ORDER — CEPHALEXIN 500 MG PO CAPS: 500.0000 mg | ORAL_CAPSULE | Freq: Two times a day (BID) | ORAL | 0 refills | Status: AC

## 2023-03-09 NOTE — Progress Notes (Signed)
Virtual Visit Consent   Virginia Kane, you are scheduled for a virtual visit with a  provider today. Just as with appointments in the office, your consent must be obtained to participate. Your consent will be active for this visit and any virtual visit you may have with one of our providers in the next 365 days. If you have a MyChart account, a copy of this consent can be sent to you electronically.  As this is a virtual visit, video technology does not allow for your provider to perform a traditional examination. This may limit your provider's ability to fully assess your condition. If your provider identifies any concerns that need to be evaluated in person or the need to arrange testing (such as labs, EKG, etc.), we will make arrangements to do so. Although advances in technology are sophisticated, we cannot ensure that it will always work on either your end or our end. If the connection with a video visit is poor, the visit may have to be switched to a telephone visit. With either a video or telephone visit, we are not always able to ensure that we have a secure connection.  By engaging in this virtual visit, you consent to the provision of healthcare and authorize for your insurance to be billed (if applicable) for the services provided during this visit. Depending on your insurance coverage, you may receive a charge related to this service.  I need to obtain your verbal consent now. Are you willing to proceed with your visit today? [name] has provided verbal consent on 03/09/2023 for a virtual visit (video or telephone). Piedad Climes, [place]  Date: 03/09/2023 2:21 PM  Virtual Visit via Video Note   I, Piedad Climes, connected with  Virginia Kane  ([id number], Dec 06, 1995) on 03/09/23 at  2:00 PM EST by a video-enabled telemedicine application and verified that I am speaking with the correct person using two identifiers.  Location: Patient: Virtual Visit Location  Patient: Home Provider: Virtual Visit Location Provider: Home Office   I discussed the limitations of evaluation and management by telemedicine and the availability of in person appointments. The patient expressed understanding and agreed to proceed.    History of Present Illness: Virginia Kane is a 27 y.o. who identifies as a female who was assigned female at birth, and is being seen today for redness, tenderness of her R upper eyelid over the past few days. Notes drainage worse in the morning, improving later int he day. Denies fever, chills, vision changes. Denies any redness of the conjunctiva itself. Denies trauma or injury.  HPI: HPI  Problems:  Patient Active Problem List   Diagnosis Date Noted   Calculus of gallbladder without cholecystitis without obstruction 06/18/2022   Right upper quadrant abdominal pain 06/18/2022   Oligohydramnios 03/12/2021   Sinus tachycardia 01/07/2019   Migraine with aura and without status migrainosus, not intractable 01/07/2019   Generalized anxiety disorder 01/07/2019   Recurrent major depressive disorder (HCC) 01/07/2019   Irritable bowel syndrome with both constipation and diarrhea 01/07/2019   Cyst of ovary 01/07/2019   Hx of epistaxis 01/07/2019   Scoliosis 01/07/2019   Anxiety 07/31/2016   Depressive disorder 07/31/2016    Allergies: No Known Allergies Medications:  Current Outpatient Medications:    cephALEXin (KEFLEX) 500 MG capsule, Take 1 capsule (500 mg total) by mouth 2 (two) times daily for 7 days., Disp: 14 capsule, Rfl: 0   aspirin-acetaminophen-caffeine (EXCEDRIN MIGRAINE) 250-250-65 MG tablet, Take 1-2 tablets by mouth  every 6 (six) hours as needed for migraine., Disp: , Rfl:    bismuth subsalicylate (PEPTO BISMOL) 262 MG/15ML suspension, Take 30 mLs by mouth every 6 (six) hours as needed., Disp: , Rfl:    colestipol (COLESTID) 1 g tablet, TAKE 2 TABLETS (2 G TOTAL) BY MOUTH DAILY., Disp: 60 tablet, Rfl: 1   dicyclomine (BENTYL)  10 MG capsule, TAKE 1 CAPSULE BY MOUTH TWICE A DAY, Disp: 60 capsule, Rfl: 2   Erenumab-aooe (AIMOVIG) 140 MG/ML SOAJ, Inject 140 mg into the skin every 28 (twenty-eight) days., Disp: 1.12 mL, Rfl: 11   levonorgestrel (MIRENA) 20 MCG/DAY IUD, 1 each by Intrauterine route once., Disp: , Rfl:    omeprazole (PRILOSEC) 40 MG capsule, TAKE 1 CAPSULE (40 MG TOTAL) BY MOUTH DAILY., Disp: 30 capsule, Rfl: 2   Ubrogepant (UBRELVY) 100 MG TABS, Take 1 tablet (100 mg total) by mouth as needed. May repeat after 2 hours.  Maximum 2 tablets in 24 hours., Disp: 16 tablet, Rfl: 11  Observations/Objective: Patient is well-developed, well-nourished in no acute distress.  Resting comfortably at home.  Head is normocephalic, atraumatic.  No labored breathing. Speech is clear and coherent with logical content.  Patient is alert and oriented at baseline.  R upper eyelid swelling, worse medial that lateral with erythema. Patient notes tenderness in this area without palpable stye. Lower lid unremarkable. Conjunctiva within normal limits.   Assessment and Plan: 1. Blepharitis of right upper eyelid, unspecified type (Primary) - cephALEXin (KEFLEX) 500 MG capsule; Take 1 capsule (500 mg total) by mouth 2 (two) times daily for 7 days.  Dispense: 14 capsule; Refill: 0  Supportive measures and OTC medications reviewed. Keflex per orders. Strict in person precautions discussed.   Follow Up Instructions: I discussed the assessment and treatment plan with the patient. The patient was provided an opportunity to ask questions and all were answered. The patient agreed with the plan and demonstrated an understanding of the instructions.  A copy of instructions were sent to the patient via MyChart unless otherwise noted below.   The patient was advised to call back or seek an in-person evaluation if the symptoms worsen or if the condition fails to improve as anticipated.    Piedad Climes, PA-C

## 2023-03-09 NOTE — Patient Instructions (Signed)
Loanne Drilling, thank you for joining Piedad Climes, PA-C for today's virtual visit.  While this provider is not your primary care provider (PCP), if your PCP is located in our provider database this encounter information will be shared with them immediately following your visit.   A [facility] MyChart account gives you access to today's visit and all your visits, tests, and labs performed at [facility] " click here if you don't have a [facility] MyChart account or go to [email or web address]  Consent: (Patient) Clare Houdeshell provided verbal consent for this virtual visit at the beginning of the encounter.  Current Medications:  Current Outpatient Medications:    aspirin-acetaminophen-caffeine (EXCEDRIN MIGRAINE) 250-250-65 MG tablet, Take 1-2 tablets by mouth every 6 (six) hours as needed for migraine., Disp: , Rfl:    bismuth subsalicylate (PEPTO BISMOL) 262 MG/15ML suspension, Take 30 mLs by mouth every 6 (six) hours as needed., Disp: , Rfl:    colestipol (COLESTID) 1 g tablet, TAKE 2 TABLETS (2 G TOTAL) BY MOUTH DAILY., Disp: 60 tablet, Rfl: 1   dicyclomine (BENTYL) 10 MG capsule, TAKE 1 CAPSULE BY MOUTH TWICE A DAY, Disp: 60 capsule, Rfl: 2   Erenumab-aooe (AIMOVIG) 140 MG/ML SOAJ, Inject 140 mg into the skin every 28 (twenty-eight) days., Disp: 1.12 mL, Rfl: 11   levonorgestrel (MIRENA) 20 MCG/DAY IUD, 1 each by Intrauterine route once., Disp: , Rfl:    omeprazole (PRILOSEC) 40 MG capsule, TAKE 1 CAPSULE (40 MG TOTAL) BY MOUTH DAILY., Disp: 30 capsule, Rfl: 2   Ubrogepant (UBRELVY) 100 MG TABS, Take 1 tablet (100 mg total) by mouth as needed. May repeat after 2 hours.  Maximum 2 tablets in 24 hours., Disp: 16 tablet, Rfl: 11   Medications ordered in this encounter:  No orders of the defined types were placed in this encounter.    *If you need refills on other medications prior to your next appointment, please contact your pharmacy*  Follow-Up: Call back or  seek an in-person evaluation if the symptoms worsen or if the condition fails to improve as anticipated.  [facility] [phone]  Other Instructions Blepharitis Blepharitis is swelling of the eyelids. It can cause the eyes to feel dry or gritty. Other symptoms may include: Reddish, scaly skin around the scalp and eyebrows. Eyelids that itch or burn. Fluid that leaks from the eye at night. This causes the eyelashes to stick together in the morning. Eyelashes that fall out. Redness of the eyes. Eyes that are sensitive to light. Follow these instructions at home: Watch for any changes in how your eyes look or feel. Tell your doctor about any changes. Follow these instructions to help with your condition. Keeping clean Wash your hands often with soap and water for at least 20 seconds. Clean your eyes. Wash the edges of your eyelids using eyelid wipes or a small amount of baby shampoo that has been mixed with warm water (diluted). Do this 2 or more times a day. Wash your face and eyebrows at least once a day. Use a clean towel each time you dry your eyelids. Do not use the towel to clean or dry other areas of your body. Do not share your towel with anyone. General instructions Avoid wearing makeup until you get better. Do not share makeup with anyone. Avoid rubbing your eyes. Use a warm compress on your eyes for 5-10 minutes at a time. Do this 1 or 2 times a day, or as told by your doctor.  You can use: A towel with warm water on it. A heating pad that can be warmed in the microwave. The pad should be very warm but not hot enough to burn the skin. If you were given an antibiotic cream or eye drops, use the medicine as told by your doctor. Do not stop using the medicine even if you feel better. Keep all follow-up visits. Contact a doctor if: Your eyelids feel hot. You have blisters on your eyelids. You have a rash on your eyelids. The swelling does not go away in 2-4  days. The swelling gets worse. Get help right away if: You have pain that gets worse or spreads to other parts of your face. You have redness that gets worse or spreads to other parts of your face. You have changes in how you see (vision). You have pain when you look at lights or things that move. You have a fever. Summary Blepharitis is swelling of the eyelids. Watch for any changes in how your eyes look or feel. Tell your doctor about any changes. Follow home care instructions as told by your doctor. Wash your hands often with soap and water for at least 20 seconds. Avoid wearing makeup. Do not rub your eyes. Use a warm compress, creams, or eye drops as told by your doctor. Let your doctor know if you have changes in how you see, blisters or a rash on your eyelids, or other problems. This information is not intended to replace advice given to you by your health care provider. Make sure you discuss any questions you have with your health care provider. Document Revised: 04/04/2020 Document Reviewed: 04/04/2020 Elsevier Patient Education  2024 Elsevier Inc.    If you have been instructed to have an in-person evaluation today at a local Urgent Care facility, please use the link below. It will take you to a list of all of our available [facility], including address, phone number and hours of operation. Please do not delay care.  [facility]  If you or a family member do not have a primary care provider, use the link below to schedule a visit and establish care. When you choose a [facility] primary care physician or advanced practice provider, you gain a long-term partner in health. Find a Primary Care Provider  Learn more about [facility]'s in-office and virtual care options:  - Get Care Now

## 2023-03-12 ENCOUNTER — Ambulatory Visit: Payer: 59 | Admitting: Internal Medicine

## 2023-04-07 ENCOUNTER — Other Ambulatory Visit: Payer: Self-pay | Admitting: Internal Medicine

## 2023-04-07 DIAGNOSIS — K319 Disease of stomach and duodenum, unspecified: Secondary | ICD-10-CM

## 2023-04-13 ENCOUNTER — Ambulatory Visit: Payer: Self-pay | Admitting: Family Medicine

## 2023-04-13 NOTE — Telephone Encounter (Signed)
Chief Complaint: Suspected concussion Symptoms: hit head, brain fog, mild confusion, sensitivity to light, feeling cold, headache Frequency: 1 hour ago Pertinent Negatives: Patient denies neck pain, vomiting, loss of consciousness Disposition: [x] ED /[] Urgent Care (no appt availability in office) / [] Appointment(In office/virtual)/ []  [facility]/ [] Home Care/ [] Refused Recommended Disposition /[] [facility]/ []  Follow-up with PCP Additional Notes: Patient called in stating she was chasing her dog in the backyard and slipped in mud, fell back and hit the back of her head on the ground. Patient states she is now experiencing a headache, mild sensitivity to light, feeling brain fog and mild confusion. Patient denies neck pain or vomiting. Advised patient to be seen in ED for further evaluation and to have another adult drive her. Patient agrees and verbalizes understanding, stating husband will be home from work soon and will take her.    Copied from CRM [id number]. Topic: Clinical - Red Word Triage >> Apr 13, 2023  2:32 PM Virginia Kane wrote: Virginia Kane that prompted transfer to Nurse Triage: patient thinks she had a concussion Reason for Disposition  [1] ACUTE NEURO SYMPTOM AND [2] now fine  (DEFINITION: difficult to awaken OR confused thinking and talking OR slurred speech OR weakness of arms OR unsteady walking)  Answer Assessment - Initial Assessment Questions 1. MECHANISM: "How did the injury happen?" For falls, ask: "What height did you fall from?" and "What surface did you fall against?"      I slipped in the mid and fell backwards, hit my back and lower hip but head hit down 2. ONSET: "When did the injury happen?" (Minutes or hours ago)      About an hour ago 3. NEUROLOGIC SYMPTOMS: "Was there any loss of consciousness?" "Are there any other neurological symptoms?"      Brain fog, feeling blank 4. MENTAL STATUS: "Does the person know who they are, who you are, and where  they are?"      Alert and Oriented x 4 5. LOCATION: "What part of the head was hit?"      Back of head 6. SCALP APPEARANCE: "What does the scalp look like? Is it bleeding now?" If Yes, ask: "Is it difficult to stop?"      No open cuts, no goose eggs 7. SIZE: For cuts, bruises, or swelling, ask: "How large is it?" (e.g., inches or centimeters)      No 8. PAIN: "Is there any pain?" If Yes, ask: "How bad is it?"  (e.g., Scale 1-10; or mild, moderate, severe)     Back left side of head is a little more painful, but have to press for it to hurt 9. TETANUS: For any breaks in the skin, ask: "When was the last tetanus booster?"     No 10. OTHER SYMPTOMS: "Do you have any other symptoms?" (e.g., neck pain, vomiting)       No  Protocols used: Head Injury-A-AH

## 2023-04-15 ENCOUNTER — Other Ambulatory Visit: Payer: Self-pay | Admitting: Nurse Practitioner

## 2023-04-15 ENCOUNTER — Other Ambulatory Visit: Payer: Self-pay | Admitting: Internal Medicine

## 2023-05-13 ENCOUNTER — Other Ambulatory Visit: Payer: Self-pay | Admitting: Nurse Practitioner

## 2023-05-13 ENCOUNTER — Other Ambulatory Visit: Payer: Self-pay | Admitting: Internal Medicine

## 2023-05-22 ENCOUNTER — Ambulatory Visit: Payer: 59 | Admitting: Neurology

## 2023-05-25 ENCOUNTER — Ambulatory Visit: Payer: 59 | Admitting: Neurology

## 2023-06-04 ENCOUNTER — Ambulatory Visit: Payer: 59 | Admitting: Internal Medicine

## 2023-06-04 ENCOUNTER — Encounter: Payer: Self-pay | Admitting: Internal Medicine

## 2023-06-04 VITALS — BP 110/80 | HR 80 | Ht 65.0 in | Wt 138.0 lb

## 2023-06-04 DIAGNOSIS — K319 Disease of stomach and duodenum, unspecified: Secondary | ICD-10-CM | POA: Diagnosis not present

## 2023-06-04 DIAGNOSIS — K529 Noninfective gastroenteritis and colitis, unspecified: Secondary | ICD-10-CM

## 2023-06-04 DIAGNOSIS — R109 Unspecified abdominal pain: Secondary | ICD-10-CM

## 2023-06-04 DIAGNOSIS — K219 Gastro-esophageal reflux disease without esophagitis: Secondary | ICD-10-CM | POA: Diagnosis not present

## 2023-06-04 MED ORDER — OMEPRAZOLE 20 MG PO CPDR: 20.0000 mg | DELAYED_RELEASE_CAPSULE | Freq: Every day | ORAL | 1 refills | Status: DC

## 2023-06-04 NOTE — Progress Notes (Signed)
 ASSESSMENT & PLAN   Diarrhea IBS Patient is currently doing well in terms of her GI symptoms if she takes her colestipol and dicyclomine regularly.  Thus I suspect that she has some component of bile acid diarrhea after she had her cholecystectomy procedure, which is why she is responding to the colestipol.  Will continue the patient on this regimen - Cont colestipol every day and dicyclomine PRN - RTC 1 year  GERD. Occasional reflux.  - Decrease omeprazole from 40 mg to 20 mg every day.   HPI   28 year old female with history of cholecystectomy, IBS, and anxiety presents for follow up of diarrhea   Interval History: Patient is doing really well. She has been taking colestipol and dicyclomine once a day. She is taking the omeprazole as well every day. If she leaves one of them out, then the symptoms will get bad again. Denies ab pain. Diarrhea is off-and-on. If she eats anything before the colestipol, then she will have diarrhea. She has nausea on occasion. Denies vomiting. She got a concussion a few months ago when she was pulled down by her dog. She is recovering from this concussion.  Previous GI Endoscopies / Labs / Imaging   RUQ U/S 06/18/22: IMPRESSION: Cholelithiasis without sonographic evidence of acute cholecystitis.  EGD 09/09/22 - Normal esophagus. - Erythematous mucosa in the antrum. Biopsied. - Two gastric polyps. Resected and retrieved. - Normal examined duodenum. Biopsied.  Diagnosis 1. Surgical [P], duodenal bx - DUODENAL MUCOSA WITH BRUNNER'S GLAND HYPERPLASIA AND FOVEOLAR METAPLASIA CONSISTENT WITH CHRONIC PEPTIC DUODENITIS. 2. Surgical [P], gastric bx - ANTRAL AND OXYNTIC MUCOSA WITH NO SIGNIFICANT PATHOLOGY. - NO HELICOBACTER PYLORI ORGANISMS IDENTIFIED ON H&E STAINED SLIDE. 3. Surgical [P], gastric polyp x2, polyp (2) - FUNDIC GLAND POLYPS.  Colonoscopy 12/03/22: - The examined portion of the ileum was normal. - Non- bleeding internal hemorrhoids. - Biopsies  were taken with a cold forceps from the entire colon for evaluation of microscopic colitis. Path: 1. Surgical [P], random colon bx :       COLONIC MUCOSA WITH NO SIGNIFICANT DIAGNOSTIC ALTERATION.       NO EVIDENCE OF LYMPHOCYTIC COLITIS OR COLLAGENOUS COLITIS.       NO EVIDENCE OF ACTIVITY, CHRONICITY, GRANULOMA, DYSPLASIA OR MALIGNANCY.       Latest Ref Rng & Units 05/30/2022    1:55 PM 02/23/2020   11:32 AM 01/14/2019    8:13 AM  Hepatic Function  Total Protein 6.0 - 8.3 g/dL 7.9  6.8  6.8   Albumin 3.5 - 5.2 g/dL 4.4  4.0    AST 0 - 37 U/L 13  20  19    ALT 0 - 35 U/L 15  25  36   Alk Phosphatase 39 - 117 U/L 81  75    Total Bilirubin 0.2 - 1.2 mg/dL 0.3  0.6  0.5        Latest Ref Rng & Units 07/21/2022   11:30 AM 05/30/2022    1:55 PM 03/13/2021    3:40 AM  CBC  WBC 4.0 - 10.5 K/uL 8.1  9.4  15.1   Hemoglobin 12.0 - 15.0 g/dL 96.0  45.4  09.8   Hematocrit 36.0 - 46.0 % 44.2  41.9  31.3   Platelets 150 - 400 K/uL 301  347.0  294      Past Medical History:  Diagnosis Date   Allergy    Seasonal   Anxiety    Complication of anesthesia  Slow to wake up after wisdom teeth removal   Depression    Epistaxis    Febrile seizure (HCC)    28 y.o.    IBS (irritable bowel syndrome)    Migraines    Ovarian cyst    Scoliosis    Tachycardia     Past Surgical History:  Procedure Laterality Date   CHOLECYSTECTOMY N/A 07/25/2022   Procedure: LAPAROSCOPIC CHOLECYSTECTOMY;  Surgeon: Griselda Miner, MD;  Location: [facility] OR;  Service: General;  Laterality: N/A;   WISDOM TOOTH EXTRACTION     2017 [facility]     Family History  Problem Relation Age of Onset   Colon polyps Mother    Sjogren's syndrome Mother    Anxiety disorder Mother    Basal cell carcinoma Mother    ADD / ADHD Mother    Cancer Mother    Depression Mother    Miscarriages / [place] Mother    Autism Brother    Anxiety disorder Brother    Colon polyps Maternal Uncle    Colon polyps Maternal  Grandmother    Diabetes Maternal Grandmother    Arthritis Maternal Grandmother    Breast cancer Maternal Grandmother    Skin cancer Maternal Grandmother    Cancer Maternal Grandmother    Colon cancer Neg Hx    Esophageal cancer Neg Hx    Rectal cancer Neg Hx    Stomach cancer Neg Hx     Current Medications, Allergies, Family History and Social History were reviewed in [employer] electronic medical record.     Current Outpatient Medications  Medication Sig Dispense Refill   aspirin-acetaminophen-caffeine (EXCEDRIN MIGRAINE) 250-250-65 MG tablet Take 1-2 tablets by mouth every 6 (six) hours as needed for migraine.     bismuth subsalicylate (PEPTO BISMOL) 262 MG/15ML suspension Take 30 mLs by mouth every 6 (six) hours as needed.     colestipol (COLESTID) 1 g tablet TAKE 2 TABLETS BY MOUTH DAILY 180 tablet 1   dicyclomine (BENTYL) 10 MG capsule TAKE 1 CAPSULE BY MOUTH TWICE A DAY (Patient taking differently: Take 10 mg by mouth once.) 180 capsule 1   Erenumab-aooe (AIMOVIG) 140 MG/ML SOAJ Inject 140 mg into the skin every 28 (twenty-eight) days. 1.12 mL 11   levonorgestrel (MIRENA) 20 MCG/DAY IUD 1 each by Intrauterine route once.     omeprazole (PRILOSEC) 40 MG capsule TAKE 1 CAPSULE (40 MG TOTAL) BY MOUTH DAILY. 90 capsule 1   Ubrogepant (UBRELVY) 100 MG TABS Take 1 tablet (100 mg total) by mouth as needed. May repeat after 2 hours.  Maximum 2 tablets in 24 hours. 16 tablet 11   No current facility-administered medications for this visit.    Physical Exam  Wt Readings from Last 3 Encounters:  06/04/23 138 lb (62.6 kg)  12/03/22 131 lb (59.4 kg)  10/29/22 131 lb 12.8 oz (59.8 kg)    BP 110/80   Pulse 80   Ht 5\' 5"  (1.651 m)   Wt 138 lb (62.6 kg)   BMI 22.96 kg/m  Constitutional:  Pleasant, generally well appearing female in no acute distress. Psychiatric: Normal mood and affect. Behavior is normal. EENT: Pupils normal.  Conjunctivae are normal. No scleral icterus. Neck  supple.  Cardiovascular: Normal rate Pulmonary/chest: Effort normal Abdominal: Soft, nondistended, nontender. Bowel sounds active throughout. There are no masses palpable. No hepatomegaly. Neurological: Alert and oriented to person place and time.    Imogene Burn, MD  06/04/2023, 10:08 AM  I spent 30  minutes of time, including in depth chart review, independent review of results as outlined above, communicating results with the patient directly, face-to-face time with the patient, coordinating care, and ordering studies and medications as appropriate, and documentation.

## 2023-06-04 NOTE — Patient Instructions (Addendum)
 Glad you are feeling better  We have sent the following medications to your pharmacy for you to pick up at your convenience: Omeprazole  20 mg take 1 time daily   _______________________________________________________  If your blood pressure at your visit was 140/90 or greater, please contact your primary care physician to follow up on this.  _______________________________________________________  If you are age 30 or older, your body mass index should be between 23-30. Your Body mass index is 22.96 kg/m. If this is out of the aforementioned range listed, please consider follow up with your Primary Care Provider.  If you are age 23 or younger, your body mass index should be between 19-25. Your Body mass index is 22.96 kg/m. If this is out of the aformentioned range listed, please consider follow up with your Primary Care Provider.   ________________________________________________________  The [facility] providers would like to encourage you to use [facility] to communicate with providers for non-urgent requests or questions.  Due to long hold times on the telephone, sending your provider a message by [facility] may be a faster and more efficient way to get a response.  Please allow 48 business hours for a response.  Please remember that this is for non-urgent requests.  _______________________________________________________   Thank you for entrusting me with your care and for choosing [facility],  Dr. Eulah Pont

## 2023-06-05 NOTE — Progress Notes (Signed)
 NEUROLOGY FOLLOW UP OFFICE NOTE  Virginia Kane [id number]  Assessment/Plan:   1.  Migraine without aura, without status migrainosus, not intractable 2.  Hemiplegic migraine, without status migrainosus, not intractable    Migraine prevention:  Aimovig 140mg  every 28 days Migraine rescue:  Ubrelvy 100mg .  Due to hemiplegic migraines, cannot take triptans.   Limit use of pain relievers to no more than 2 days out of week to prevent risk of rebound or medication-overuse headache. Keep headache diary Follow up 6 months.    Subjective:  Virginia Kane is a 28 year old female with sinus tachycardia, depression and anxiety who follows up for hemiplegic migraine.   UPDATE: Doing well.   Intensity:  mild Duration:  less than 1 hour with Excedrin or Ubrelvy  Frequency:  once a month or less A few days prior to next injection, her head "feels weird" although it typically doesn't progress into a migraine.     Current NSAIDS:  none Current analgesics:  Excedrin Current triptans:  none Current ergotamine:  none Current anti-emetic:  none Current muscle relaxants:  none Current anti-anxiolytic:  none Current sleep aide:  none Current Antihypertensive medications:  None Current Antidepressant medications:  none Current Anticonvulsant medications:  None Current anti-CGRP:  Aimovig 140mg , Ubrelvy 100mg  Current Vitamins/Herbal/Supplements:  none Current Antihistamines/Decongestants:  none Other therapy:  none Hormone/birth control:  Mirena     Diet:  Drinks 2 big water bottles daily.  Now mostly vegan diet.       HISTORY:  Onset:  Migraines since childhood.  Relatively well-controlled Location:  Holocephalic but primarily bitemporal Quality:  Pressure and throbbing Initial intensity:  If delays treatment, then 8/10; if treats early, then 6/10; denies new headache, thunderclap headache or severe headache that wakes  from sleep. Aura:  none Premonitory Phase:  none Postdrome:   Usually no Associated symptoms:  Nausea, vomiting, blurred vision, photophobia, phonophobia, osmophobia.  Rarely, vomiting or pain is so severe that she has passed out. She has had 3 episodes of left hemiplegic migraine (arm and leg) associated with left sided numbness (including face) with some slurred speech, that resolves by the next day.  Initial duration:  At least 8 hour Initial frequency:  4 to 5 times a month Initial frequency of abortive medication: 4 to 5 days a month Triggers:  Emotional stress, alcohol (certain beer and wine) Relieving factors:  Applying pressure to temples, sometimes sleep, early treatment with Excedrin. Activity:  aggravates     Past NSAIDS:  Advil, naproxen Past analgesics:  Tylenol Past abortive triptans:  Sumatriptan tablet Past abortive ergotamine:  none Past muscle relaxants:  none Past anti-emetic:  none Past antihypertensive medications:  metoprolol Past antidepressant medications:  amitriptyline 100mg  QHS (effective) Past anticonvulsant medications:  none Past anti-CGRP:  Ubrelvy 100mg  (effective) Past vitamins/Herbal/Supplements:  magnesium Past antihistamines/decongestants:  none Other past therapies:  none     Family history of headache:  Mother (migraines) Other history:  Sinus tachycardia since age 54.  Has been evaluated by cardiology.  On metoprolol and Corlandor.  Echocardiogram scheduled.  PAST MEDICAL HISTORY: Past Medical History:  Diagnosis Date   Allergy    Seasonal   Anxiety    Complication of anesthesia    Slow to wake up after wisdom teeth removal   Depression    Epistaxis    Febrile seizure (HCC)    28 y.o.    IBS (irritable bowel syndrome)    Migraines    Ovarian cyst  Scoliosis    Tachycardia     MEDICATIONS: Current Outpatient Medications on File Prior to Visit  Medication Sig Dispense Refill   aspirin-acetaminophen-caffeine (EXCEDRIN MIGRAINE) 250-250-65 MG tablet Take 1-2 tablets by mouth every 6 (six)  hours as needed for migraine.     bismuth subsalicylate (PEPTO BISMOL) 262 MG/15ML suspension Take 30 mLs by mouth every 6 (six) hours as needed.     colestipol (COLESTID) 1 g tablet TAKE 2 TABLETS BY MOUTH DAILY 180 tablet 1   dicyclomine (BENTYL) 10 MG capsule TAKE 1 CAPSULE BY MOUTH TWICE A DAY (Patient taking differently: Take 10 mg by mouth once.) 180 capsule 1   Erenumab-aooe (AIMOVIG) 140 MG/ML SOAJ Inject 140 mg into the skin every 28 (twenty-eight) days. 1.12 mL 11   levonorgestrel (MIRENA) 20 MCG/DAY IUD 1 each by Intrauterine route once.     omeprazole (PRILOSEC) 20 MG capsule Take 1 capsule (20 mg total) by mouth daily. 90 capsule 1   Ubrogepant (UBRELVY) 100 MG TABS Take 1 tablet (100 mg total) by mouth as needed. May repeat after 2 hours.  Maximum 2 tablets in 24 hours. 16 tablet 11   No current facility-administered medications on file prior to visit.    ALLERGIES: No Known Allergies  FAMILY HISTORY: Family History  Problem Relation Age of Onset   Colon polyps Mother    Sjogren's syndrome Mother    Anxiety disorder Mother    Basal cell carcinoma Mother    ADD / ADHD Mother    Cancer Mother    Depression Mother    Miscarriages / [place] Mother    Autism Brother    Anxiety disorder Brother    Colon polyps Maternal Uncle    Colon polyps Maternal Grandmother    Diabetes Maternal Grandmother    Arthritis Maternal Grandmother    Breast cancer Maternal Grandmother    Skin cancer Maternal Grandmother    Cancer Maternal Grandmother    Colon cancer Neg Hx    Esophageal cancer Neg Hx    Rectal cancer Neg Hx    Stomach cancer Neg Hx       Objective:  Blood pressure 115/64, pulse 98, resp. rate 18, weight 139 lb (63 kg), SpO2 100%. General: No acute distress.  Patient appears well-groomed.   Head:  Normocephalic/atraumatic Neck:  Supple.  No paraspinal tenderness.  Full range of motion. Heart:  Regular rate and rhythm. Neuro:  alert and oriented.  Speech fluent  and not dysarthric, language intact.  CN II-XII intact. Bulk and tone normal, muscle strength 5/5 throughout.  Sensation to light touch intact.  Deep tendon reflexes 2+ throughout.  Finger to nose testing intact.  Gait normal, Romberg negative.    Shon Millet, DO  CC: Virginia Blend, NP-C

## 2023-06-08 ENCOUNTER — Encounter: Payer: Self-pay | Admitting: Neurology

## 2023-06-08 ENCOUNTER — Ambulatory Visit (INDEPENDENT_AMBULATORY_CARE_PROVIDER_SITE_OTHER): Payer: 59 | Admitting: Neurology

## 2023-06-08 VITALS — BP 115/64 | HR 98 | Resp 18 | Wt 139.0 lb

## 2023-06-08 DIAGNOSIS — G43009 Migraine without aura, not intractable, without status migrainosus: Secondary | ICD-10-CM

## 2023-06-08 DIAGNOSIS — G43409 Hemiplegic migraine, not intractable, without status migrainosus: Secondary | ICD-10-CM

## 2023-06-08 MED ORDER — UBRELVY 100 MG PO TABS: 1.0000 | ORAL_TABLET | ORAL | 11 refills | Status: AC | PRN

## 2023-06-08 MED ORDER — AIMOVIG 140 MG/ML ~~LOC~~ SOAJ: 140.0000 mg | SUBCUTANEOUS | 11 refills | Status: AC

## 2023-06-08 NOTE — Patient Instructions (Signed)
 Aimovig every 28 days Ubrelvy as needed

## 2023-10-01 ENCOUNTER — Telehealth: Payer: Self-pay | Admitting: Pharmacy Technician

## 2023-10-01 ENCOUNTER — Other Ambulatory Visit (HOSPITAL_COMMUNITY): Payer: Self-pay

## 2023-10-01 NOTE — Telephone Encounter (Signed)
 Pharmacy Patient Advocate Encounter   Received notification from CoverMyMeds that prior authorization for UBRELVY  100MG  is required/requested.   Insurance verification completed.   The patient is insured through [facility] .   Per test claim: PA required; PA submitted to above mentioned insurance via CoverMyMeds Key/confirmation #/EOC [id number] Status is pending

## 2023-10-07 NOTE — Telephone Encounter (Signed)
 Pharmacy Patient Advocate Encounter  Received notification from [employer] that Prior Authorization for Ubrelvy  100MG  tablets has been APPROVED from 10-01-2023 to 09-30-2025   PA #/Case ID/Reference #: [id number]

## 2023-10-21 ENCOUNTER — Ambulatory Visit: Admitting: Family Medicine

## 2023-10-21 VITALS — BP 108/70 | HR 80 | Temp 97.6°F | Ht 65.0 in | Wt 143.0 lb

## 2023-10-21 DIAGNOSIS — Z0000 Encounter for general adult medical examination without abnormal findings: Secondary | ICD-10-CM | POA: Diagnosis not present

## 2023-10-21 DIAGNOSIS — Z0001 Encounter for general adult medical examination with abnormal findings: Secondary | ICD-10-CM

## 2023-10-21 NOTE — Progress Notes (Signed)
 Complete physical exam  Patient: Virginia Kane   DOB: 02-14-1996   28 y.o. Female  MRN: [id number]  Subjective:    Chief Complaint  Patient presents with   Annual Exam   She is here for a complete physical exam.  [facility]   Discussed the use of AI scribe software for clinical note transcription with the patient, who gave verbal consent to proceed.  History of Present Illness Virginia Kane is a 28 year old female who presents for a complete physical exam.  Preventive health maintenance - Life insurance physical earlier this year with normal blood work, including kidney function and cholesterol - Up to date with specialists, including OBGYN, GI, and neurology - Normal Pap smear last year - Uses Mirena IUD for contraception - Wears sunscreen daily due to family history of skin cancer  Neurological symptoms - No numbness, tingling, or unusual sensations in legs or feet  Gastrointestinal function - Bowel movements are regular  Ophthalmologic care - Wears contacts and glasses - Recently re-established care with optometrist     Health Maintenance  Topic Date Due   Pap with HPV screening  09/24/2023   Flu Shot  10/16/2023   COVID-19 Vaccine (3 - 2024-25 season) 11/06/2023*   DTaP/Tdap/Td vaccine (10 - Td or Tdap) 01/01/2031   Hepatitis B Vaccine  Completed   HPV Vaccine  Completed   Hepatitis C Screening  Completed   HIV Screening  Completed   Meningitis B Vaccine  Aged Out  *Topic was postponed. The date shown is not the original due date.    Wears seatbelt always, uses sunscreen, smoke detectors in home and functioning, does not text while driving, feels safe in home environment.  Depression screening:    10/21/2023    2:34 PM 05/30/2022    1:15 PM  Depression screen PHQ 2/9  Decreased Interest 0 0  Down, Depressed, Hopeless 1 0  PHQ - 2 Score 1 0  Altered sleeping 1 2  Tired, decreased energy 1 1  Change in appetite 0 2  Feeling bad or failure  about yourself  1 1  Trouble concentrating 0 0  Moving slowly or fidgety/restless 0 0  Suicidal thoughts 0 0  PHQ-9 Score 4 6  Difficult doing work/chores Not difficult at all Not difficult at all   Anxiety Screening:    10/21/2023    2:35 PM 05/30/2022    1:15 PM  GAD 7 : Generalized Anxiety Score  Nervous, Anxious, on Edge 1 2  Control/stop worrying 1 2  Worry too much - different things 1 2  Trouble relaxing 1 1  Restless 0 0  Easily annoyed or irritable 1 1  Afraid - awful might happen 0 0  Total GAD 7 Score 5 8  Anxiety Difficulty Somewhat difficult Not difficult at all     Patient Care Team: Lendia Boby CROME, NP-C as PCP - General (Family Medicine) Nahser, Aleene PARAS, MD (Inactive) as PCP - Cardiology (Cardiology)   Outpatient Medications Prior to Visit  Medication Sig   aspirin-acetaminophen -caffeine (EXCEDRIN MIGRAINE) 250-250-65 MG tablet Take 1-2 tablets by mouth every 6 (six) hours as needed for migraine.   bismuth subsalicylate (PEPTO BISMOL) 262 MG/15ML suspension Take 30 mLs by mouth every 6 (six) hours as needed.   colestipol  (COLESTID ) 1 g tablet TAKE 2 TABLETS BY MOUTH DAILY   dicyclomine  (BENTYL ) 10 MG capsule TAKE 1 CAPSULE BY MOUTH TWICE A DAY (Patient taking differently: Take 10 mg by mouth once.)  Erenumab -aooe (AIMOVIG ) 140 MG/ML SOAJ Inject 140 mg into the skin every 28 (twenty-eight) days.   levonorgestrel (MIRENA) 20 MCG/DAY IUD 1 each by Intrauterine route once.   omeprazole  (PRILOSEC) 20 MG capsule Take 1 capsule (20 mg total) by mouth daily.   Ubrogepant  (UBRELVY ) 100 MG TABS Take 1 tablet (100 mg total) by mouth as needed. May repeat after 2 hours.  Maximum 2 tablets in 24 hours.   No facility-administered medications prior to visit.    Review of Systems  Constitutional:  Negative for chills and fever.  HENT:  Negative for congestion, ear pain, sinus pain and sore throat.   Eyes:  Negative for blurred vision, double vision and pain.   Respiratory:  Negative for cough, shortness of breath and wheezing.   Cardiovascular:  Negative for chest pain, palpitations and leg swelling.  Gastrointestinal:  Negative for abdominal pain, constipation, diarrhea, nausea and vomiting.  Genitourinary:  Negative for dysuria, frequency and urgency.  Musculoskeletal:  Negative for back pain, joint pain and myalgias.  Skin:  Negative for rash.  Neurological:  Negative for dizziness, tingling, focal weakness and headaches.  Psychiatric/Behavioral:  Negative for depression. The patient is not nervous/anxious.        Objective:    BP 108/70   Pulse 80   Temp 97.6 F (36.4 C) (Temporal)   Ht 5' 5 (1.651 m)   Wt 143 lb (64.9 kg)   SpO2 99%   BMI 23.80 kg/m  BP Readings from Last 3 Encounters:  10/21/23 108/70  06/08/23 115/64  06/04/23 110/80   Wt Readings from Last 3 Encounters:  10/21/23 143 lb (64.9 kg)  06/08/23 139 lb (63 kg)  06/04/23 138 lb (62.6 kg)    Physical Exam Constitutional:      General: She is not in acute distress.    Appearance: She is not ill-appearing.  HENT:     Right Ear: Tympanic membrane, ear canal and external ear normal.     Left Ear: Tympanic membrane, ear canal and external ear normal.     Nose: Nose normal.     Mouth/Throat:     Mouth: Mucous membranes are moist.     Pharynx: Oropharynx is clear.  Eyes:     Extraocular Movements: Extraocular movements intact.     Conjunctiva/sclera: Conjunctivae normal.     Pupils: Pupils are equal, round, and reactive to light.  Neck:     Thyroid: No thyroid mass, thyromegaly or thyroid tenderness.  Cardiovascular:     Rate and Rhythm: Normal rate and regular rhythm.     Pulses: Normal pulses.     Heart sounds: Normal heart sounds.  Pulmonary:     Effort: Pulmonary effort is normal.     Breath sounds: Normal breath sounds.  Abdominal:     General: Bowel sounds are normal.     Palpations: Abdomen is soft.     Tenderness: There is no abdominal  tenderness. There is no right CVA tenderness, left CVA tenderness, guarding or rebound.  Musculoskeletal:        General: Normal range of motion.     Cervical back: Normal range of motion and neck supple. No tenderness.     Right lower leg: No edema.     Left lower leg: No edema.  Lymphadenopathy:     Cervical: No cervical adenopathy.  Skin:    General: Skin is warm and dry.     Findings: No lesion or rash.  Neurological:     General:  No focal deficit present.     Mental Status: She is alert and oriented to person, place, and time.     Cranial Nerves: No cranial nerve deficit.     Sensory: No sensory deficit.     Motor: No weakness.     Gait: Gait normal.  Psychiatric:        Mood and Affect: Mood normal.        Behavior: Behavior normal.        Thought Content: Thought content normal.      No results found for any visits on 10/21/23.    Assessment & Plan:    Routine Health Maintenance and Physical Exam  Problem List Items Addressed This Visit   None Visit Diagnoses       Encounter for general adult medical examination with abnormal findings    -  Primary       Assessment & Plan Adult Wellness Visit 28 year old female in good health with no current complaints. Up to date with specialists, including OBGYN, GI, and neurology, and on effective medication management. Mirena IUD in place. Recent life insurance physical and labs were normal. Pap smear from last year was normal. Does not smoke, uses alcohol occasionally, exercises less than desired. Diet is well-balanced. Uses sunscreen regularly due to family history of basal cell carcinoma. Feels safe in her environment with functional smoke and carbon monoxide alarms. Sees dentist every six months and has recently re-established care with an optometrist. - Upload recent lab results to the portal for review. - Request Pap smear results from [facility] for documentation. - Schedule next physical exam with OBGYN. - Advise  to continue using sunscreen daily. - Encourage regular exercise. - Follow up in one year for routine wellness visit.    Return in about 1 year (around 10/20/2024) for fasting CPE.     Boby Mackintosh, NP-C

## 2023-10-22 ENCOUNTER — Encounter: Payer: Self-pay | Admitting: Obstetrics and Gynecology

## 2023-11-04 ENCOUNTER — Other Ambulatory Visit: Payer: Self-pay | Admitting: Obstetrics and Gynecology

## 2023-11-04 DIAGNOSIS — N644 Mastodynia: Secondary | ICD-10-CM

## 2023-11-10 ENCOUNTER — Ambulatory Visit
Admission: RE | Admit: 2023-11-10 | Discharge: 2023-11-10 | Disposition: A | Discharge status: Home / Self Care | Source: Ambulatory Visit | Attending: Obstetrics and Gynecology | Admitting: Obstetrics and Gynecology

## 2023-11-10 ENCOUNTER — Ambulatory Visit
Admission: RE | Admit: 2023-11-10 | Discharge: 2023-11-10 | Disposition: A | Discharge status: Home / Self Care | Source: Ambulatory Visit | Attending: Obstetrics and Gynecology

## 2023-11-10 DIAGNOSIS — N644 Mastodynia: Secondary | ICD-10-CM

## 2023-11-11 ENCOUNTER — Other Ambulatory Visit

## 2023-11-22 ENCOUNTER — Other Ambulatory Visit: Payer: Self-pay | Admitting: Internal Medicine

## 2023-11-22 ENCOUNTER — Other Ambulatory Visit: Payer: Self-pay | Admitting: Nurse Practitioner

## 2024-01-18 ENCOUNTER — Other Ambulatory Visit: Payer: Self-pay | Admitting: Internal Medicine

## 2024-01-18 DIAGNOSIS — K319 Disease of stomach and duodenum, unspecified: Secondary | ICD-10-CM

## 2024-01-22 ENCOUNTER — Telehealth: Payer: Self-pay | Admitting: Pharmacy Technician

## 2024-01-22 ENCOUNTER — Encounter: Payer: Self-pay | Admitting: Neurology

## 2024-01-22 ENCOUNTER — Other Ambulatory Visit (HOSPITAL_COMMUNITY): Payer: Self-pay

## 2024-01-22 NOTE — Telephone Encounter (Signed)
 Pharmacy Patient Advocate Encounter   Received notification from Patient Advice Request messages that prior authorization for AIMOVIG  140MG  is required/requested.   Insurance verification completed.   The patient is insured through [facility].   Per test claim: PA required; PA submitted to above mentioned insurance via Latent Key/confirmation #/EOC [id number] Status is pending

## 2024-01-22 NOTE — Telephone Encounter (Signed)
 PA has been submitted, and telephone encounter has been created. Please see telephone encounter dated 11.7.25.

## 2024-01-25 ENCOUNTER — Other Ambulatory Visit (HOSPITAL_COMMUNITY): Payer: Self-pay

## 2024-01-25 NOTE — Telephone Encounter (Signed)
 This can now be filled at pt pharmacy. It is approved

## 2024-01-25 NOTE — Telephone Encounter (Signed)
 Pharmacy Patient Advocate Encounter  Received notification from [employer] that Prior Authorization for AIMOVIG  140MG  has been APPROVED from 11.7.25 to 11.7.27. Ran test claim, Copay is $40. This test claim was processed through [facility]- copay amounts may vary at other pharmacies due to pharmacy/plan contracts, or as the patient moves through the different stages of their insurance plan.   PA #/Case ID/Reference #: [id number]

## 2024-06-07 ENCOUNTER — Ambulatory Visit: Admitting: Neurology

## 2024-10-26 ENCOUNTER — Encounter: Admitting: Family Medicine
